# Patient Record
Sex: Male | Born: 1945 | Race: White | Hispanic: No | Smoking: Never smoker
Health system: Southern US, Community
[De-identification: ages and names within clinical notes are randomized; demographics above are authoritative.]

## PROBLEM LIST (undated history)

## (undated) DIAGNOSIS — E785 Hyperlipidemia, unspecified: Secondary | ICD-10-CM

## (undated) DIAGNOSIS — J302 Other seasonal allergic rhinitis: Secondary | ICD-10-CM

## (undated) DIAGNOSIS — F419 Anxiety disorder, unspecified: Secondary | ICD-10-CM

## (undated) DIAGNOSIS — K635 Polyp of colon: Secondary | ICD-10-CM

## (undated) DIAGNOSIS — E119 Type 2 diabetes mellitus without complications: Secondary | ICD-10-CM

## (undated) DIAGNOSIS — E559 Vitamin D deficiency, unspecified: Secondary | ICD-10-CM

## (undated) DIAGNOSIS — T7840XA Allergy, unspecified, initial encounter: Secondary | ICD-10-CM

## (undated) HISTORY — DX: Anxiety disorder, unspecified: F41.9

## (undated) HISTORY — PX: COLONOSCOPY: SHX174

## (undated) HISTORY — PX: VASECTOMY: SHX75

## (undated) HISTORY — DX: Polyp of colon: K63.5

## (undated) HISTORY — DX: Type 2 diabetes mellitus without complications: E11.9

## (undated) HISTORY — PX: POLYPECTOMY: SHX149

## (undated) HISTORY — DX: Other seasonal allergic rhinitis: J30.2

## (undated) HISTORY — PX: HERNIA REPAIR: SHX51

## (undated) HISTORY — DX: Allergy, unspecified, initial encounter: T78.40XA

## (undated) HISTORY — DX: Vitamin D deficiency, unspecified: E55.9

## (undated) HISTORY — DX: Hyperlipidemia, unspecified: E78.5

---

## 2001-10-03 HISTORY — PX: CERVICAL SPINE SURGERY: SHX589

## 2004-10-03 HISTORY — PX: ROTATOR CUFF REPAIR: SHX139

## 2011-10-04 HISTORY — PX: PARS PLANA VITRECTOMY W/ REPAIR OF MACULAR HOLE: SHX2170

## 2012-10-03 HISTORY — PX: CATARACT EXTRACTION: SUR2

## 2014-01-20 DIAGNOSIS — H903 Sensorineural hearing loss, bilateral: Secondary | ICD-10-CM | POA: Diagnosis not present

## 2014-04-15 DIAGNOSIS — D239 Other benign neoplasm of skin, unspecified: Secondary | ICD-10-CM | POA: Diagnosis not present

## 2014-04-15 DIAGNOSIS — L819 Disorder of pigmentation, unspecified: Secondary | ICD-10-CM | POA: Diagnosis not present

## 2014-04-15 DIAGNOSIS — L821 Other seborrheic keratosis: Secondary | ICD-10-CM | POA: Diagnosis not present

## 2014-04-15 DIAGNOSIS — D485 Neoplasm of uncertain behavior of skin: Secondary | ICD-10-CM | POA: Diagnosis not present

## 2014-04-15 DIAGNOSIS — D235 Other benign neoplasm of skin of trunk: Secondary | ICD-10-CM | POA: Diagnosis not present

## 2014-06-19 DIAGNOSIS — Z136 Encounter for screening for cardiovascular disorders: Secondary | ICD-10-CM | POA: Diagnosis not present

## 2014-06-19 DIAGNOSIS — F411 Generalized anxiety disorder: Secondary | ICD-10-CM | POA: Diagnosis not present

## 2014-06-19 DIAGNOSIS — F329 Major depressive disorder, single episode, unspecified: Secondary | ICD-10-CM | POA: Diagnosis not present

## 2014-06-19 DIAGNOSIS — Z1211 Encounter for screening for malignant neoplasm of colon: Secondary | ICD-10-CM | POA: Diagnosis not present

## 2014-06-19 DIAGNOSIS — E559 Vitamin D deficiency, unspecified: Secondary | ICD-10-CM | POA: Diagnosis not present

## 2014-06-19 DIAGNOSIS — F3289 Other specified depressive episodes: Secondary | ICD-10-CM | POA: Diagnosis not present

## 2014-06-19 DIAGNOSIS — R7309 Other abnormal glucose: Secondary | ICD-10-CM | POA: Diagnosis not present

## 2014-06-19 DIAGNOSIS — E785 Hyperlipidemia, unspecified: Secondary | ICD-10-CM | POA: Diagnosis not present

## 2014-06-19 DIAGNOSIS — N138 Other obstructive and reflux uropathy: Secondary | ICD-10-CM | POA: Diagnosis not present

## 2014-06-19 DIAGNOSIS — Z125 Encounter for screening for malignant neoplasm of prostate: Secondary | ICD-10-CM | POA: Diagnosis not present

## 2014-06-19 DIAGNOSIS — I10 Essential (primary) hypertension: Secondary | ICD-10-CM | POA: Diagnosis not present

## 2014-06-19 DIAGNOSIS — J3089 Other allergic rhinitis: Secondary | ICD-10-CM | POA: Diagnosis not present

## 2014-06-19 DIAGNOSIS — N401 Enlarged prostate with lower urinary tract symptoms: Secondary | ICD-10-CM | POA: Diagnosis not present

## 2014-06-19 DIAGNOSIS — Z23 Encounter for immunization: Secondary | ICD-10-CM | POA: Diagnosis not present

## 2014-06-23 DIAGNOSIS — Z1211 Encounter for screening for malignant neoplasm of colon: Secondary | ICD-10-CM | POA: Diagnosis not present

## 2014-07-02 DIAGNOSIS — Z23 Encounter for immunization: Secondary | ICD-10-CM | POA: Diagnosis not present

## 2014-07-16 DIAGNOSIS — R9431 Abnormal electrocardiogram [ECG] [EKG]: Secondary | ICD-10-CM | POA: Diagnosis not present

## 2014-07-16 DIAGNOSIS — R0602 Shortness of breath: Secondary | ICD-10-CM | POA: Diagnosis not present

## 2014-07-16 DIAGNOSIS — I491 Atrial premature depolarization: Secondary | ICD-10-CM | POA: Diagnosis not present

## 2014-07-16 DIAGNOSIS — I493 Ventricular premature depolarization: Secondary | ICD-10-CM | POA: Diagnosis not present

## 2014-07-16 DIAGNOSIS — E785 Hyperlipidemia, unspecified: Secondary | ICD-10-CM | POA: Diagnosis not present

## 2014-07-16 DIAGNOSIS — R5383 Other fatigue: Secondary | ICD-10-CM | POA: Diagnosis not present

## 2014-08-05 DIAGNOSIS — D123 Benign neoplasm of transverse colon: Secondary | ICD-10-CM | POA: Diagnosis not present

## 2014-08-05 DIAGNOSIS — K635 Polyp of colon: Secondary | ICD-10-CM | POA: Diagnosis not present

## 2014-08-05 DIAGNOSIS — K5730 Diverticulosis of large intestine without perforation or abscess without bleeding: Secondary | ICD-10-CM | POA: Diagnosis not present

## 2014-08-05 DIAGNOSIS — Z5181 Encounter for therapeutic drug level monitoring: Secondary | ICD-10-CM | POA: Diagnosis not present

## 2014-08-05 DIAGNOSIS — D122 Benign neoplasm of ascending colon: Secondary | ICD-10-CM | POA: Diagnosis not present

## 2014-08-05 DIAGNOSIS — Z1211 Encounter for screening for malignant neoplasm of colon: Secondary | ICD-10-CM | POA: Diagnosis not present

## 2014-12-15 DIAGNOSIS — E559 Vitamin D deficiency, unspecified: Secondary | ICD-10-CM | POA: Diagnosis not present

## 2014-12-15 DIAGNOSIS — E782 Mixed hyperlipidemia: Secondary | ICD-10-CM | POA: Diagnosis not present

## 2014-12-15 DIAGNOSIS — R7309 Other abnormal glucose: Secondary | ICD-10-CM | POA: Diagnosis not present

## 2014-12-15 DIAGNOSIS — Z125 Encounter for screening for malignant neoplasm of prostate: Secondary | ICD-10-CM | POA: Diagnosis not present

## 2014-12-19 DIAGNOSIS — F329 Major depressive disorder, single episode, unspecified: Secondary | ICD-10-CM | POA: Diagnosis not present

## 2014-12-19 DIAGNOSIS — E1165 Type 2 diabetes mellitus with hyperglycemia: Secondary | ICD-10-CM | POA: Diagnosis not present

## 2014-12-19 DIAGNOSIS — J302 Other seasonal allergic rhinitis: Secondary | ICD-10-CM | POA: Diagnosis not present

## 2014-12-19 DIAGNOSIS — I10 Essential (primary) hypertension: Secondary | ICD-10-CM | POA: Diagnosis not present

## 2014-12-19 DIAGNOSIS — E559 Vitamin D deficiency, unspecified: Secondary | ICD-10-CM | POA: Diagnosis not present

## 2014-12-19 DIAGNOSIS — N401 Enlarged prostate with lower urinary tract symptoms: Secondary | ICD-10-CM | POA: Diagnosis not present

## 2014-12-19 DIAGNOSIS — E782 Mixed hyperlipidemia: Secondary | ICD-10-CM | POA: Diagnosis not present

## 2015-07-06 ENCOUNTER — Ambulatory Visit: Payer: Self-pay | Admitting: Adult Health

## 2015-08-10 ENCOUNTER — Ambulatory Visit: Payer: Self-pay | Admitting: Adult Health

## 2015-08-10 DIAGNOSIS — H26492 Other secondary cataract, left eye: Secondary | ICD-10-CM | POA: Diagnosis not present

## 2015-08-10 DIAGNOSIS — H35342 Macular cyst, hole, or pseudohole, left eye: Secondary | ICD-10-CM | POA: Diagnosis not present

## 2015-08-10 DIAGNOSIS — H26491 Other secondary cataract, right eye: Secondary | ICD-10-CM | POA: Diagnosis not present

## 2015-08-10 DIAGNOSIS — Z961 Presence of intraocular lens: Secondary | ICD-10-CM | POA: Diagnosis not present

## 2015-08-10 DIAGNOSIS — H02839 Dermatochalasis of unspecified eye, unspecified eyelid: Secondary | ICD-10-CM | POA: Diagnosis not present

## 2015-09-08 ENCOUNTER — Ambulatory Visit (INDEPENDENT_AMBULATORY_CARE_PROVIDER_SITE_OTHER): Payer: Medicare Other | Admitting: Family Medicine

## 2015-09-08 ENCOUNTER — Telehealth: Payer: Self-pay | Admitting: Adult Health

## 2015-09-08 ENCOUNTER — Encounter: Payer: Self-pay | Admitting: Family Medicine

## 2015-09-08 VITALS — BP 131/74 | HR 75 | Temp 98.1°F | Resp 16 | Ht 68.0 in | Wt 191.0 lb

## 2015-09-08 DIAGNOSIS — F329 Major depressive disorder, single episode, unspecified: Secondary | ICD-10-CM

## 2015-09-08 DIAGNOSIS — F32A Depression, unspecified: Secondary | ICD-10-CM

## 2015-09-08 MED ORDER — SERTRALINE HCL 100 MG PO TABS: 150.0000 mg | ORAL_TABLET | Freq: Every day | ORAL | Status: DC

## 2015-09-08 NOTE — Telephone Encounter (Signed)
Mrs. Reitmeier called saying the pt has been out of Zoloft for days and he really needs a refill. I informed her Tommi Rumps wouldn't prescribe it until he's seen. She took him to an Urgent Care today and was told they wouldn't give him a Rx. I suggested she call [facility] off of [address] to see if they can help. She wanted me to send you a message just in case.  Pt's ph# [phone] Thank you.

## 2015-09-08 NOTE — Progress Notes (Signed)
   Subjective:    Patient ID: Jeffrey Spears, male    DOB: Jul 19, 1946, 69 y.o.   MRN: [id number]  HPI This is a pleasant 69 yo male who presents today for medication refill. He is accompanied by his wife. He has an appointment to establish care at [facility] for next week. He has been out of his sertraline for about a week and is having some increased irritation. He was previously on 100 mg and has been for a long time. He feels like it doesn't work as well as it used to and would like to go to try 150 mg.   Past Medical History  Diagnosis Date  . Hyperlipidemia    Past Surgical History  Procedure Laterality Date  . Hernia repair    . Vasectomy    . Spine surgery     Family History  Problem Relation Age of Onset  . Emphysema Mother   . Heart disease Father   . Stroke Brother   . Alzheimer's disease Brother    Social History  Substance Use Topics  . Smoking status: Never Smoker   . Smokeless tobacco: None  . Alcohol Use: 0.0 oz/week    0 Standard drinks or equivalent per week    Review of Systems No chest pain or SOB, no edema.     Objective:   Physical Exam Physical Exam  Constitutional: Oriented to person, place, and time. He appears well-developed and well-nourished.  HENT:  Head: Normocephalic and atraumatic.  Eyes: Conjunctivae are normal.  Neck: Normal range of motion. Neck supple.  Cardiovascular: Normal rate, regular rhythm and normal heart sounds.   Pulmonary/Chest: Effort normal and breath sounds normal.  Musculoskeletal: Normal range of motion.  Neurological: Alert and oriented to person, place, and time.  Skin: Skin is warm and dry.  Psychiatric: Normal mood and affect. Behavior is normal. Judgment and thought content normal.  Vitals reviewed. BP 131/74 mmHg  Pulse 75  Temp(Src) 98.1 F (36.7 C)  Resp 16  Ht 5\' 8"  (1.727 m)  Wt 191 lb (86.637 kg)  BMI 29.05 kg/m2 Depression screen [facility] 2/9 09/08/2015  Decreased Interest 0  Down, Depressed,  Hopeless 0  PHQ - 2 Score 0      Assessment & Plan:  1. Depression - will fill his sertraline for 30 days, no refills to get him to his appointment to establish care.  - sertraline (ZOLOFT) 100 MG tablet; Take 1.5 tablets (150 mg total) by mouth daily.  Dispense: 45 tablet; Refill: 0  Clarene Reamer, FNP-BC  [facility]  09/08/2015 2:01 PM

## 2015-09-08 NOTE — Telephone Encounter (Signed)
He needs to see me before I will send him in anything

## 2015-09-16 ENCOUNTER — Ambulatory Visit (INDEPENDENT_AMBULATORY_CARE_PROVIDER_SITE_OTHER): Payer: Medicare Other | Admitting: Adult Health

## 2015-09-16 ENCOUNTER — Encounter: Payer: Self-pay | Admitting: Adult Health

## 2015-09-16 VITALS — BP 110/70 | Temp 98.5°F | Ht 68.0 in | Wt 195.4 lb

## 2015-09-16 DIAGNOSIS — F411 Generalized anxiety disorder: Secondary | ICD-10-CM | POA: Diagnosis not present

## 2015-09-16 DIAGNOSIS — E119 Type 2 diabetes mellitus without complications: Secondary | ICD-10-CM | POA: Diagnosis not present

## 2015-09-16 DIAGNOSIS — Z23 Encounter for immunization: Secondary | ICD-10-CM

## 2015-09-16 DIAGNOSIS — Z7689 Persons encountering health services in other specified circumstances: Secondary | ICD-10-CM

## 2015-09-16 DIAGNOSIS — Z7189 Other specified counseling: Secondary | ICD-10-CM

## 2015-09-16 MED ORDER — SERTRALINE HCL 100 MG PO TABS: 150.0000 mg | ORAL_TABLET | Freq: Every day | ORAL | Status: DC

## 2015-09-16 MED ORDER — GLUCOSE BLOOD VI STRP: 1.0000 | ORAL_STRIP | Freq: Every day | Status: DC

## 2015-09-16 NOTE — Progress Notes (Signed)
Pre visit review using our clinic review tool, if applicable. No additional management support is needed unless otherwise documented below in the visit note. 

## 2015-09-16 NOTE — Patient Instructions (Signed)
It was great meeting you today!  Follow up with me in March for your next physical. If you need anything in the meantime,then please let me know.   Continue to monitor your blood sugars.

## 2015-09-16 NOTE — Progress Notes (Signed)
HPI:  Jeffrey Spears is here to establish care. He is a pleasant caucasian male who  has a past medical history of Hyperlipidemia; Seasonal allergies; High cholesterol; and Colon polyp. He has recently moved from [place] to [place] so that he and his wife can be closer to family.    Last PCP and physical: March 2016 with Primary Care  Immunizations:UTD Diet: Eats healthy  Exercise: He does exercise. Walks and belongs to the [facility] Colonoscopy: Every three years due to polyps  Has the following chronic problems that require follow up and concerns today:  Anxiety  - Takes Zoloft and feels like 150 mg has been the best dose for his anxiety.   Diabetes  - He was diagnosed later in life by his old PCP. His records indicate that the most recent A1c is 7.0. He does not monitor his glucose often and does not take any medications.    ROS negative for unless reported above: fevers, chills,feeling poorly, unintentional weight loss, hearing or vision loss, chest pain, palpitations, leg claudication, struggling to breath,Not feeling congested in the chest, no orthopenia, no cough,no wheezing, normal appetite, no soft tissue swelling, no hemoptysis, melena, hematochezia, hematuria, falls, loc, si, or thoughts of self harm.    Past Medical History  Diagnosis Date  . Hyperlipidemia     Past Surgical History  Procedure Laterality Date  . Hernia repair    . Vasectomy    . Spine surgery      Family History  Problem Relation Age of Onset  . Emphysema Mother   . Heart disease Father   . Stroke Brother   . Alzheimer's disease Brother     Social History   Social History  . Marital Status: Married    Spouse Name: N/A  . Number of Children: N/A  . Years of Education: N/A   Social History Main Topics  . Smoking status: Never Smoker   . Smokeless tobacco: None  . Alcohol Use: 0.0 oz/week    0 Standard drinks or equivalent per week  . Drug Use: No  . Sexual Activity: Yes    Other Topics Concern  . None   Social History Narrative     Current outpatient prescriptions:  .  ACCU-CHEK SOFTCLIX LANCETS lancets, by Other route. Check once daily., Disp: , Rfl:  .  ergocalciferol (VITAMIN D2) 50000 UNITS capsule, Take 50,000 Units by mouth once a week., Disp: , Rfl:  .  glucose blood (ACCU-CHEK AVIVA PLUS) test strip, 1 each by Other route daily. Use as instructed, Disp: , Rfl:  .  sertraline (ZOLOFT) 100 MG tablet, Take 1.5 tablets (150 mg total) by mouth daily., Disp: 45 tablet, Rfl: 0 .  tamsulosin (FLOMAX) 0.4 MG CAPS capsule, Take 0.4 mg by mouth 2 (two) times daily., Disp: , Rfl:  .  atorvastatin (LIPITOR) 40 MG tablet, Take 40 mg by mouth daily., Disp: , Rfl:  .  fluticasone (FLONASE) 50 MCG/ACT nasal spray, Place into both nostrils daily., Disp: , Rfl:   EXAM:  Filed Vitals:   09/16/15 1355  Temp: 98.5 F (36.9 C)    Body mass index is 29.72 kg/(m^2).  GENERAL: vitals reviewed and listed above, alert, oriented, appears well hydrated and in no acute distress.  HEENT: atraumatic, conjunttiva clear, no obvious abnormalities on inspection of external nose and ears  NECK: Neck is soft and supple without masses, no adenopathy or thyromegaly, trachea midline, no JVD. Normal range of motion.   LUNGS: clear to  auscultation bilaterally, no wheezes, rales or rhonchi, good air movement  CV: Regular rate and rhythm, normal S1/S2, no audible murmurs, gallops, or rubs. No carotid bruit and no peripheral edema.   MS: moves all extremities without noticeable abnormality. No edema noted  Abd: soft/nontender/nondistended/normal bowel sounds   Skin: warm and dry, no rash   Extremities: No clubbing, cyanosis, or edema. Capillary refill is WNL. Pulses intact bilaterally in upper and lower extremities.   Neuro: CN II-XII intact, sensation and reflexes normal throughout, 5/5 muscle strength in bilateral upper and lower extremities. Normal finger to nose. Normal  rapid alternating movements. Normal romberg. No pronator drift.   PSYCH: pleasant and cooperative, no obvious depression or anxiety  ASSESSMENT AND PLAN:  1. Encounter to establish care - Follow up in March for CPE - Follow up sooner if needed - Work on diet and exercise 2. Generalized anxiety disorder - sertraline (ZOLOFT) 100 MG tablet; Take 1.5 tablets (150 mg total) by mouth daily.  Dispense: 145 tablet; Refill: 3  3. Controlled type 2 diabetes mellitus without complication, without long-term current use of insulin (HCC) - ACCU-CHEK SOFTCLIX LANCETS lancets; by Other route. Check once daily. - glucose blood (ACCU-CHEK AVIVA PLUS) test strip; 1 each by Other route daily. Use as instructed  Dispense: 100 each; Refill: 3 - Advised to keep closer control of his blood sugars.    Discussed the following assessment and plan:  -We reviewed the PMH, PSH, FH, SH, Meds and Allergies. -We provided refills for any medications we will prescribe as needed. -We addressed current concerns per orders and patient instructions. -We have asked for records for pertinent exams, studies, vaccines and notes from previous providers. -We have advised patient to follow up per instructions below.   -Patient advised to return or notify a provider immediately if symptoms worsen or persist or new concerns arise.    Dorothyann Peng, AGNP

## 2015-10-22 DIAGNOSIS — S39012A Strain of muscle, fascia and tendon of lower back, initial encounter: Secondary | ICD-10-CM | POA: Diagnosis not present

## 2015-10-22 DIAGNOSIS — M62830 Muscle spasm of back: Secondary | ICD-10-CM | POA: Diagnosis not present

## 2015-10-22 DIAGNOSIS — M5136 Other intervertebral disc degeneration, lumbar region: Secondary | ICD-10-CM | POA: Diagnosis not present

## 2015-10-22 DIAGNOSIS — M545 Low back pain: Secondary | ICD-10-CM | POA: Diagnosis not present

## 2015-12-15 ENCOUNTER — Telehealth: Payer: Self-pay | Admitting: Adult Health

## 2015-12-15 ENCOUNTER — Ambulatory Visit (INDEPENDENT_AMBULATORY_CARE_PROVIDER_SITE_OTHER): Payer: Medicare Other | Admitting: Adult Health

## 2015-12-15 ENCOUNTER — Other Ambulatory Visit: Payer: Self-pay | Admitting: Adult Health

## 2015-12-15 ENCOUNTER — Encounter: Payer: Self-pay | Admitting: Adult Health

## 2015-12-15 VITALS — BP 120/80 | Temp 97.9°F | Ht 68.0 in | Wt 195.6 lb

## 2015-12-15 DIAGNOSIS — E119 Type 2 diabetes mellitus without complications: Secondary | ICD-10-CM | POA: Diagnosis not present

## 2015-12-15 DIAGNOSIS — N401 Enlarged prostate with lower urinary tract symptoms: Secondary | ICD-10-CM

## 2015-12-15 DIAGNOSIS — E559 Vitamin D deficiency, unspecified: Secondary | ICD-10-CM | POA: Insufficient documentation

## 2015-12-15 DIAGNOSIS — Z0000 Encounter for general adult medical examination without abnormal findings: Secondary | ICD-10-CM

## 2015-12-15 DIAGNOSIS — N138 Other obstructive and reflux uropathy: Secondary | ICD-10-CM

## 2015-12-15 DIAGNOSIS — E785 Hyperlipidemia, unspecified: Secondary | ICD-10-CM | POA: Diagnosis not present

## 2015-12-15 LAB — POC URINALSYSI DIPSTICK (AUTOMATED)
BILIRUBIN UA: NEGATIVE
Glucose, UA: NEGATIVE
KETONES UA: NEGATIVE
Leukocytes, UA: NEGATIVE
Nitrite, UA: NEGATIVE
PH UA: 7
Protein, UA: NEGATIVE
RBC UA: NEGATIVE
SPEC GRAV UA: 1.02
Urobilinogen, UA: 0.2

## 2015-12-15 LAB — CBC WITH DIFFERENTIAL/PLATELET
BASOS PCT: 0.4 % (ref 0.0–3.0)
Basophils Absolute: 0 10*3/uL (ref 0.0–0.1)
EOS ABS: 0.3 10*3/uL (ref 0.0–0.7)
Eosinophils Relative: 6 % — ABNORMAL HIGH (ref 0.0–5.0)
HCT: 41.7 % (ref 39.0–52.0)
HEMOGLOBIN: 14 g/dL (ref 13.0–17.0)
Lymphocytes Relative: 27.9 % (ref 12.0–46.0)
Lymphs Abs: 1.2 10*3/uL (ref 0.7–4.0)
MCHC: 33.6 g/dL (ref 30.0–36.0)
MCV: 90.3 fl (ref 78.0–100.0)
MONO ABS: 0.4 10*3/uL (ref 0.1–1.0)
Monocytes Relative: 8.3 % (ref 3.0–12.0)
Neutro Abs: 2.5 10*3/uL (ref 1.4–7.7)
Neutrophils Relative %: 57.4 % (ref 43.0–77.0)
Platelets: 247 10*3/uL (ref 150.0–400.0)
RBC: 4.62 Mil/uL (ref 4.22–5.81)
RDW: 14 % (ref 11.5–15.5)
WBC: 4.4 10*3/uL (ref 4.0–10.5)

## 2015-12-15 LAB — LIPID PANEL
CHOLESTEROL: 272 mg/dL — AB (ref 0–200)
HDL: 47.8 mg/dL (ref >39.00)
LDL CALC: 189 mg/dL — AB (ref 0–99)
NonHDL: 224.42
Total CHOL/HDL Ratio: 6
Triglycerides: 176 mg/dL — ABNORMAL HIGH (ref 0.0–149.0)
VLDL: 35.2 mg/dL (ref 0.0–40.0)

## 2015-12-15 LAB — HEPATIC FUNCTION PANEL
ALT: 13 U/L (ref 0–53)
AST: 16 U/L (ref 0–37)
Albumin: 4 g/dL (ref 3.5–5.2)
Alkaline Phosphatase: 54 U/L (ref 39–117)
BILIRUBIN TOTAL: 0.5 mg/dL (ref 0.2–1.2)
Bilirubin, Direct: 0.1 mg/dL (ref 0.0–0.3)
Total Protein: 7.1 g/dL (ref 6.0–8.3)

## 2015-12-15 LAB — BASIC METABOLIC PANEL
BUN: 20 mg/dL (ref 6–23)
CALCIUM: 9.1 mg/dL (ref 8.4–10.5)
CO2: 30 mEq/L (ref 19–32)
CREATININE: 1.1 mg/dL (ref 0.40–1.50)
Chloride: 102 mEq/L (ref 96–112)
GFR: 70.34 mL/min (ref >60.00)
Glucose, Bld: 123 mg/dL — ABNORMAL HIGH (ref 70–99)
Potassium: 4.6 mEq/L (ref 3.5–5.1)
Sodium: 139 mEq/L (ref 135–145)

## 2015-12-15 LAB — VITAMIN D 25 HYDROXY (VIT D DEFICIENCY, FRACTURES): VITD: 12.24 ng/mL — ABNORMAL LOW (ref 30.00–100.00)

## 2015-12-15 LAB — TSH: TSH: 1.34 u[IU]/mL (ref 0.35–4.50)

## 2015-12-15 LAB — HEMOGLOBIN A1C: HEMOGLOBIN A1C: 6.5 % (ref 4.6–6.5)

## 2015-12-15 LAB — PSA: PSA: 8.44 ng/mL — ABNORMAL HIGH (ref 0.10–4.00)

## 2015-12-15 MED ORDER — TAMSULOSIN HCL 0.4 MG PO CAPS: 0.4000 mg | ORAL_CAPSULE | Freq: Two times a day (BID) | ORAL | Status: DC

## 2015-12-15 MED ORDER — ATORVASTATIN CALCIUM 40 MG PO TABS: 40.0000 mg | ORAL_TABLET | Freq: Every day | ORAL | Status: DC

## 2015-12-15 MED ORDER — FLUTICASONE PROPIONATE 50 MCG/ACT NA SUSP: 2.0000 | Freq: Every day | NASAL | Status: DC

## 2015-12-15 NOTE — Progress Notes (Signed)
Pre visit review using our clinic review tool, if applicable. No additional management support is needed unless otherwise documented below in the visit note. 

## 2015-12-15 NOTE — Telephone Encounter (Signed)
Spoke to [place] and informed him of his labs. He is going to restart Lipitor 40mg  and 800 units of Vitamin D

## 2015-12-15 NOTE — Progress Notes (Signed)
Subjective:  Patient presents today for their annual wellness visit. He is a pleasant Caucasian male who  has a past medical history of Hyperlipidemia; Seasonal allergies; Colon polyp; Anxiety; Vitamin D deficiency; and Diabetes mellitus ([place]).    Preventive Screening-Counseling & Management  Smoking Status: Never Smoker Second Hand Smoking status: No smokers in home  Risk Factors Regular exercise: Walks and belongs to [facility] Diet: Eats healthy Fall Risk: None  Cardiac risk factors:  advanced age (older than 23 for men, 61 for women)  Hyperlipidemia Diabetes.  Family History: Heart Disease and Stroke  Depression Screen None. PHQ2 0   Activities of Daily Living Independent ADLs and IADLs  Hearing Difficulties:   Cognitive Testing No reported trouble.   Normal 3 word recall  List the Names of Other Physician/Practitioners you currently use: 1.None  Immunization History  Administered Date(s) Administered  . Influenza, High Dose Seasonal PF 09/16/2015  . Influenza-Unspecified 07/27/2010, 10/11/2011, 07/19/2013, 06/19/2014  . Pneumococcal Conjugate-13 07/02/2014  . Pneumococcal Polysaccharide-23 10/11/2011  . Tdap 08/12/2013   Required Immunizations needed today: None, UTD  Screening tests- up to date Health Maintenance Due  Topic Date Due  . Hepatitis C Screening  [birth year]  . FOOT EXAM  12/10/1955  . OPHTHALMOLOGY EXAM  12/10/1955  . HEMOGLOBIN A1C  06/17/2015  . COLONOSCOPY  10/04/2015  . URINE MICROALBUMIN  12/15/2015    ROS- No pertinent positives discovered in course of AWV  The following were reviewed and entered/updated in epic: Past Medical History  Diagnosis Date  . Hyperlipidemia   . Seasonal allergies   . Colon polyp   . Anxiety   . Vitamin D deficiency   . Diabetes mellitus [facility])    Patient Active Problem List   Diagnosis Date Noted  . Diabetes type 2, controlled ([place]) 09/16/2015   Past Surgical History  Procedure  Laterality Date  . Hernia repair    . Vasectomy      1982  . Cervical spine surgery  2003    herniated disk   . Rotator cuff repair  2006    left shoulder  . Cataract extraction  2014    bilateral   . Pars plana vitrectomy w/ repair of macular hole  2013    Family History  Problem Relation Age of Onset  . Emphysema Mother   . Heart disease Father   . Stroke Brother   . Alzheimer's disease Brother   . Alzheimer's disease Maternal Grandmother     Medications- reviewed and updated Current Outpatient Prescriptions  Medication Sig Dispense Refill  . ACCU-CHEK SOFTCLIX LANCETS lancets by Other route. Check once daily.    Marland Kitchen atorvastatin (LIPITOR) 40 MG tablet Take 40 mg by mouth daily.    . ergocalciferol (VITAMIN D2) 50000 UNITS capsule Take 50,000 Units by mouth once a week.    . fluticasone (FLONASE) 50 MCG/ACT nasal spray Place 2 sprays into both nostrils daily. 48 g 3  . glucose blood (ACCU-CHEK AVIVA PLUS) test strip 1 each by Other route daily. Use as instructed 100 each 3  . sertraline (ZOLOFT) 100 MG tablet Take 1.5 tablets (150 mg total) by mouth daily. 145 tablet 3  . tamsulosin (FLOMAX) 0.4 MG CAPS capsule Take 1 capsule (0.4 mg total) by mouth 2 (two) times daily. 90 capsule 3   No current facility-administered medications for this visit.    Allergies-reviewed and updated No Known Allergies  Social History   Social History  . Marital Status: Married  Spouse Name: N/A  . Number of Children: N/A  . Years of Education: N/A   Social History Main Topics  . Smoking status: Never Smoker   . Smokeless tobacco: None  . Alcohol Use: 0.0 oz/week    0 Standard drinks or equivalent per week  . Drug Use: No  . Sexual Activity: Yes   Other Topics Concern  . 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        Objective: BP 120/80 mmHg  Temp(Src) 97.9 F (36.6  C) (Oral)  Ht 5\' 8"  (1.727 m)  Wt 195 lb 9.6 oz (88.724 kg)  BMI 29.75 kg/m2 GENERAL: vitals reviewed and listed above, alert, oriented, appears well hydrated and in no acute distress.  HEENT: atraumatic, conjunttiva clear, no obvious abnormalities on inspection of external nose and ears  NECK: Neck is soft and supple without masses, no adenopathy or thyromegaly, trachea midline, no JVD. Normal range of motion.   LUNGS: clear to auscultation bilaterally, no wheezes, rales or rhonchi, good air movement  CV: Regular rate and rhythm, normal S1/S2, no audible murmurs, gallops, or rubs. No carotid bruit and no peripheral edema.   MS: moves all extremities without noticeable abnormality. No edema noted  Abd: soft/nontender/nondistended/normal bowel sounds .Slightly obese  Prostate: No masses or lumps. Slightly enlarged. Guaiac Negative  Skin: warm and dry, no rash   Extremities: No clubbing, cyanosis, or edema. Capillary refill is WNL. Pulses intact bilaterally in upper and lower extremities.   Neuro: CN II-XII intact, sensation and reflexes normal throughout, 5/5 muscle strength in bilateral upper and lower extremities. Normal finger to nose. Normal rapid alternating movements. Normal romberg. No pronator drift.   PSYCH: pleasant and cooperative, no obvious depression or anxiety  Assessment/Plan: 1. Hyperlipidemia  - EKG 12-Lead- NSR,Rate 62 - POCT Urinalysis Dipstick (Automated) - Basic metabolic panel - CBC with Differential/Platelet - Hemoglobin A1c - Hepatic function panel - Lipid panel - TSH - PSA  2. Medicare annual wellness visit, subsequent - Follow up in one year - Continue to exercise and eat healthy - Hep C Antibody - Reviewed medicare wellness form.   3. Controlled type 2 diabetes mellitus without complication, without long-term current use of insulin ([place]) - Diet controlled currently.  - Basic metabolic panel - CBC with Differential/Platelet - Hemoglobin  A1c - Hepatic function panel - Lipid panel - TSH  4. BPH with urinary obstruction - tamsulosin (FLOMAX) 0.4 MG CAPS capsule; Take 1 capsule (0.4 mg total) by mouth 2 (two) times daily.  Dispense: 90 capsule; Refill: 3 - PSA  5. Vitamin D deficiency - Vitamin D, 25-hydroxy - Consider Vitamin D supplement.    Orders Placed This Encounter  Procedures  . EKG 12-Lead    Meds ordered this encounter  Medications  . fluticasone (FLONASE) 50 MCG/ACT nasal spray    Sig: Place 2 sprays into both nostrils daily.    Dispense:  48 g    Refill:  3  . tamsulosin (FLOMAX) 0.4 MG CAPS capsule    Sig: Take 1 capsule (0.4 mg total) by mouth 2 (two) times daily.    Dispense:  90 capsule    Refill:  [place]

## 2015-12-15 NOTE — Patient Instructions (Addendum)
It was great seeing you again! I am glad that you are adjusting well to [place].   Your exam looks great! I will follow up with you regarding your blood work.   Follow up with me in one year for your next exam or sooner if needed  Health Maintenance, Male A healthy lifestyle and preventative care can promote health and wellness.  Maintain regular health, dental, and eye exams.  Eat a healthy diet. Foods like vegetables, fruits, whole grains, low-fat dairy products, and lean protein foods contain the nutrients you need and are low in calories. Decrease your intake of foods high in solid fats, added sugars, and salt. Get information about a proper diet from your health care provider, if necessary.  Regular physical exercise is one of the most important things you can do for your health. Most adults should get at least 150 minutes of moderate-intensity exercise (any activity that increases your heart rate and causes you to sweat) each week. In addition, most adults need muscle-strengthening exercises on 2 or more days a week.   Maintain a healthy weight. The body mass index (BMI) is a screening tool to identify possible weight problems. It provides an estimate of body fat based on height and weight. Your health care provider can find your BMI and can help you achieve or maintain a healthy weight. For males 20 years and older:  A BMI below 18.5 is considered underweight.  A BMI of 18.5 to 24.9 is normal.  A BMI of 25 to 29.9 is considered overweight.  A BMI of 30 and above is considered obese.  Maintain normal blood lipids and cholesterol by exercising and minimizing your intake of saturated fat. Eat a balanced diet with plenty of fruits and vegetables. Blood tests for lipids and cholesterol should begin at age 60 and be repeated every 5 years. If your lipid or cholesterol levels are high, you are over age 8, or you are at high risk for heart disease, you may need your cholesterol levels  checked more frequently.Ongoing high lipid and cholesterol levels should be treated with medicines if diet and exercise are not working.  If you smoke, find out from your health care provider how to quit. If you do not use tobacco, do not start.  Lung cancer screening is recommended for adults aged 20-80 years who are at high risk for developing lung cancer because of a history of smoking. A yearly low-dose CT scan of the lungs is recommended for people who have at least a 30-pack-year history of smoking and are current smokers or have quit within the past 15 years. A pack year of smoking is smoking an average of 1 pack of cigarettes a day for 1 year (for example, a 30-pack-year history of smoking could mean smoking 1 pack a day for 30 years or 2 packs a day for 15 years). Yearly screening should continue until the smoker has stopped smoking for at least 15 years. Yearly screening should be stopped for people who develop a health problem that would prevent them from having lung cancer treatment.  If you choose to drink alcohol, do not have more than 2 drinks per day. One drink is considered to be 12 oz (360 mL) of beer, 5 oz (150 mL) of wine, or 1.5 oz (45 mL) of liquor.  Avoid the use of street drugs. Do not share needles with anyone. Ask for help if you need support or instructions about stopping the use of drugs.  High  blood pressure causes heart disease and increases the risk of stroke. High blood pressure is more likely to develop in:  People who have blood pressure in the end of the normal range (100-139/85-89 mm Hg).  People who are overweight or obese.  People who are African American.  If you are 9-35 years of age, have your blood pressure checked every 3-5 years. If you are 46 years of age or older, have your blood pressure checked every year. You should have your blood pressure measured twice--once when you are at a hospital or clinic, and once when you are not at a hospital or clinic.  Record the average of the two measurements. To check your blood pressure when you are not at a hospital or clinic, you can use:  An automated blood pressure machine at a pharmacy.  A home blood pressure monitor.  If you are 69-10 years old, ask your health care provider if you should take aspirin to prevent heart disease.  Diabetes screening involves taking a blood sample to check your fasting blood sugar level. This should be done once every 3 years after age 56 if you are at a normal weight and without risk factors for diabetes. Testing should be considered at a younger age or be carried out more frequently if you are overweight and have at least 1 risk factor for diabetes.  Colorectal cancer can be detected and often prevented. Most routine colorectal cancer screening begins at the age of 52 and continues through age 43. However, your health care provider may recommend screening at an earlier age if you have risk factors for colon cancer. On a yearly basis, your health care provider may provide home test kits to check for hidden blood in the stool. A small camera at the end of a tube may be used to directly examine the colon (sigmoidoscopy or colonoscopy) to detect the earliest forms of colorectal cancer. Talk to your health care provider about this at age 55 when routine screening begins. A direct exam of the colon should be repeated every 5-10 years through age 35, unless early forms of precancerous polyps or small growths are found.  People who are at an increased risk for hepatitis B should be screened for this virus. You are considered at high risk for hepatitis B if:  You were born in a country where hepatitis B occurs often. Talk with your health care provider about which countries are considered high risk.  Your parents were born in a high-risk country and you have not received a shot to protect against hepatitis B (hepatitis B vaccine).  You have HIV or AIDS.  You use needles to  inject street drugs.  You live with, or have sex with, someone who has hepatitis B.  You are a man who has sex with other men (MSM).  You get hemodialysis treatment.  You take certain medicines for conditions like cancer, organ transplantation, and autoimmune conditions.  Hepatitis C blood testing is recommended for all people born from [birth year] through 1965 and any individual with known risk factors for hepatitis C.  Healthy men should no longer receive prostate-specific antigen (PSA) blood tests as part of routine cancer screening. Talk to your health care provider about prostate cancer screening.  Testicular cancer screening is not recommended for adolescents or adult males who have no symptoms. Screening includes self-exam, a health care provider exam, and other screening tests. Consult with your health care provider about any symptoms you have or any concerns  you have about testicular cancer.  Practice safe sex. Use condoms and avoid high-risk sexual practices to reduce the spread of sexually transmitted infections (STIs).  You should be screened for STIs, including gonorrhea and chlamydia if:  You are sexually active and are younger than 24 years.  You are older than 24 years, and your health care provider tells you that you are at risk for this type of infection.  Your sexual activity has changed since you were last screened, and you are at an increased risk for chlamydia or gonorrhea. Ask your health care provider if you are at risk.  If you are at risk of being infected with HIV, it is recommended that you take a prescription medicine daily to prevent HIV infection. This is called pre-exposure prophylaxis (PrEP). You are considered at risk if:  You are a man who has sex with other men (MSM).  You are a heterosexual man who is sexually active with multiple partners.  You take drugs by injection.  You are sexually active with a partner who has HIV.  Talk with your health care  provider about whether you are at high risk of being infected with HIV. If you choose to begin PrEP, you should first be tested for HIV. You should then be tested every 3 months for as long as you are taking PrEP.  Use sunscreen. Apply sunscreen liberally and repeatedly throughout the day. You should seek shade when your shadow is shorter than you. Protect yourself by wearing long sleeves, pants, a wide-brimmed hat, and sunglasses year round whenever you are outdoors.  Tell your health care provider of new moles or changes in moles, especially if there is a change in shape or color. Also, tell your health care provider if a mole is larger than the size of a pencil eraser.  A one-time screening for abdominal aortic aneurysm (AAA) and surgical repair of large AAAs by ultrasound is recommended for men aged 48-75 years who are current or former smokers.  Stay current with your vaccines (immunizations).   This information is not intended to replace advice given to you by your health care provider. Make sure you discuss any questions you have with your health care provider.   Document Released: 03/17/2008 Document Revised: 10/10/2014 Document Reviewed: 02/14/2011 Elsevier Interactive Patient Education [employer].

## 2015-12-16 LAB — HEPATITIS C ANTIBODY: HCV Ab: NEGATIVE

## 2015-12-22 ENCOUNTER — Telehealth: Payer: Self-pay | Admitting: Adult Health

## 2015-12-22 NOTE — Telephone Encounter (Addendum)
Pt has medicare and needs referral for audiiologist exam . Pt will see teryl delagrange fax [phone] phone [phone]. Pt has an appt on 12-24-15

## 2015-12-22 NOTE — Telephone Encounter (Signed)
See below

## 2015-12-23 ENCOUNTER — Other Ambulatory Visit: Payer: Self-pay | Admitting: Adult Health

## 2015-12-23 DIAGNOSIS — Z0110 Encounter for examination of ears and hearing without abnormal findings: Secondary | ICD-10-CM

## 2015-12-23 NOTE — Telephone Encounter (Signed)
Order placed

## 2015-12-23 NOTE — Telephone Encounter (Signed)
Patient notified

## 2015-12-27 DIAGNOSIS — R300 Dysuria: Secondary | ICD-10-CM | POA: Diagnosis not present

## 2016-01-06 DIAGNOSIS — H903 Sensorineural hearing loss, bilateral: Secondary | ICD-10-CM | POA: Diagnosis not present

## 2016-02-08 DIAGNOSIS — H26491 Other secondary cataract, right eye: Secondary | ICD-10-CM | POA: Diagnosis not present

## 2016-05-11 ENCOUNTER — Other Ambulatory Visit: Payer: Self-pay | Admitting: Adult Health

## 2016-05-11 DIAGNOSIS — N401 Enlarged prostate with lower urinary tract symptoms: Principal | ICD-10-CM

## 2016-05-11 DIAGNOSIS — N138 Other obstructive and reflux uropathy: Secondary | ICD-10-CM

## 2016-07-02 ENCOUNTER — Other Ambulatory Visit: Payer: Self-pay | Admitting: Adult Health

## 2016-07-02 DIAGNOSIS — N138 Other obstructive and reflux uropathy: Secondary | ICD-10-CM

## 2016-07-02 DIAGNOSIS — N401 Enlarged prostate with lower urinary tract symptoms: Principal | ICD-10-CM

## 2016-08-20 ENCOUNTER — Other Ambulatory Visit: Payer: Self-pay | Admitting: Adult Health

## 2016-08-20 DIAGNOSIS — F411 Generalized anxiety disorder: Secondary | ICD-10-CM

## 2016-09-22 ENCOUNTER — Other Ambulatory Visit: Payer: Self-pay | Admitting: Emergency Medicine

## 2016-10-05 ENCOUNTER — Other Ambulatory Visit: Payer: Self-pay

## 2016-10-05 ENCOUNTER — Telehealth: Payer: Self-pay | Admitting: Adult Health

## 2016-10-05 DIAGNOSIS — F411 Generalized anxiety disorder: Secondary | ICD-10-CM

## 2016-10-05 MED ORDER — SERTRALINE HCL 100 MG PO TABS: 150.0000 mg | ORAL_TABLET | Freq: Every day | ORAL | 1 refills | Status: DC

## 2016-10-05 NOTE — Telephone Encounter (Signed)
Rx has been sent in. 

## 2016-10-05 NOTE — Telephone Encounter (Signed)
Pt needs refill on sertraline 100 mg #90 w/refills [facility]

## 2016-10-05 NOTE — Telephone Encounter (Signed)
Ok to refill. 135 pills + 1 refill

## 2016-10-05 NOTE — Telephone Encounter (Signed)
Ok to refill 

## 2016-10-07 ENCOUNTER — Ambulatory Visit: Payer: Medicare Other | Admitting: Adult Health

## 2016-10-12 ENCOUNTER — Telehealth: Payer: Self-pay | Admitting: Adult Health

## 2016-10-12 NOTE — Telephone Encounter (Signed)
Reference # [id number]  Sertaline 100mg  [facility] need to have the supervising provider of Jeffrey Spears (full name,address,NPI) and the persons that is approving this informations (full name and title) so that medication can be sent to patient.

## 2016-10-13 NOTE — Telephone Encounter (Signed)
I called and spoke with pharmacist at [facility] and information has been given. Rx will be sent out. Thanks!

## 2016-11-07 ENCOUNTER — Other Ambulatory Visit: Payer: Self-pay | Admitting: Adult Health

## 2016-11-07 DIAGNOSIS — N138 Other obstructive and reflux uropathy: Secondary | ICD-10-CM

## 2016-11-07 DIAGNOSIS — N401 Enlarged prostate with lower urinary tract symptoms: Principal | ICD-10-CM

## 2016-12-15 ENCOUNTER — Ambulatory Visit (INDEPENDENT_AMBULATORY_CARE_PROVIDER_SITE_OTHER): Payer: Medicare Other | Admitting: Adult Health

## 2016-12-15 ENCOUNTER — Other Ambulatory Visit: Payer: Self-pay

## 2016-12-15 ENCOUNTER — Other Ambulatory Visit: Payer: Self-pay | Admitting: Adult Health

## 2016-12-15 VITALS — BP 124/82 | Temp 98.1°F | Ht 68.0 in | Wt 196.0 lb

## 2016-12-15 DIAGNOSIS — E559 Vitamin D deficiency, unspecified: Secondary | ICD-10-CM

## 2016-12-15 DIAGNOSIS — E785 Hyperlipidemia, unspecified: Secondary | ICD-10-CM | POA: Diagnosis not present

## 2016-12-15 DIAGNOSIS — E119 Type 2 diabetes mellitus without complications: Secondary | ICD-10-CM

## 2016-12-15 DIAGNOSIS — Z1211 Encounter for screening for malignant neoplasm of colon: Secondary | ICD-10-CM

## 2016-12-15 DIAGNOSIS — N138 Other obstructive and reflux uropathy: Secondary | ICD-10-CM

## 2016-12-15 DIAGNOSIS — N401 Enlarged prostate with lower urinary tract symptoms: Secondary | ICD-10-CM

## 2016-12-15 LAB — CBC WITH DIFFERENTIAL/PLATELET
BASOS PCT: 0.7 % (ref 0.0–3.0)
Basophils Absolute: 0 10*3/uL (ref 0.0–0.1)
EOS PCT: 4 % (ref 0.0–5.0)
Eosinophils Absolute: 0.2 10*3/uL (ref 0.0–0.7)
HEMATOCRIT: 42.5 % (ref 39.0–52.0)
HEMOGLOBIN: 14.1 g/dL (ref 13.0–17.0)
LYMPHS PCT: 13.7 % (ref 12.0–46.0)
Lymphs Abs: 0.8 10*3/uL (ref 0.7–4.0)
MCHC: 33.1 g/dL (ref 30.0–36.0)
MCV: 91.4 fl (ref 78.0–100.0)
Monocytes Absolute: 0.4 10*3/uL (ref 0.1–1.0)
Monocytes Relative: 6.8 % (ref 3.0–12.0)
Neutro Abs: 4.5 10*3/uL (ref 1.4–7.7)
Neutrophils Relative %: 74.8 % (ref 43.0–77.0)
Platelets: 216 10*3/uL (ref 150.0–400.0)
RBC: 4.65 Mil/uL (ref 4.22–5.81)
RDW: 13.7 % (ref 11.5–15.5)
WBC: 6.1 10*3/uL (ref 4.0–10.5)

## 2016-12-15 LAB — LIPID PANEL
CHOL/HDL RATIO: 3
Cholesterol: 181 mg/dL (ref 0–200)
HDL: 56.8 mg/dL (ref >39.00)
LDL CALC: 103 mg/dL — AB (ref 0–99)
NONHDL: 124.2
TRIGLYCERIDES: 105 mg/dL (ref 0.0–149.0)
VLDL: 21 mg/dL (ref 0.0–40.0)

## 2016-12-15 LAB — POC URINALSYSI DIPSTICK (AUTOMATED)
BILIRUBIN UA: NEGATIVE
GLUCOSE UA: NEGATIVE
KETONES UA: NEGATIVE
Leukocytes, UA: NEGATIVE
Nitrite, UA: NEGATIVE
PH UA: 6
Protein, UA: NEGATIVE
RBC UA: NEGATIVE
SPEC GRAV UA: 1.025
Urobilinogen, UA: 0.2

## 2016-12-15 LAB — MICROALBUMIN / CREATININE URINE RATIO
CREATININE, U: 170.7 mg/dL
MICROALB/CREAT RATIO: 0.4 mg/g (ref 0.0–30.0)
Microalb, Ur: 0.7 mg/dL (ref 0.0–1.9)

## 2016-12-15 LAB — BASIC METABOLIC PANEL
BUN: 18 mg/dL (ref 6–23)
CO2: 29 mEq/L (ref 19–32)
CREATININE: 1.07 mg/dL (ref 0.40–1.50)
Calcium: 9 mg/dL (ref 8.4–10.5)
Chloride: 104 mEq/L (ref 96–112)
GFR: 72.41 mL/min (ref >60.00)
Glucose, Bld: 120 mg/dL — ABNORMAL HIGH (ref 70–99)
POTASSIUM: 4.2 meq/L (ref 3.5–5.1)
SODIUM: 142 meq/L (ref 135–145)

## 2016-12-15 LAB — VITAMIN D 25 HYDROXY (VIT D DEFICIENCY, FRACTURES): VITD: 16.3 ng/mL — ABNORMAL LOW (ref 30.00–100.00)

## 2016-12-15 LAB — HEPATIC FUNCTION PANEL
ALBUMIN: 4.2 g/dL (ref 3.5–5.2)
ALK PHOS: 55 U/L (ref 39–117)
ALT: 21 U/L (ref 0–53)
AST: 19 U/L (ref 0–37)
BILIRUBIN TOTAL: 0.5 mg/dL (ref 0.2–1.2)
Bilirubin, Direct: 0.1 mg/dL (ref 0.0–0.3)
Total Protein: 7.1 g/dL (ref 6.0–8.3)

## 2016-12-15 LAB — HEMOGLOBIN A1C: HEMOGLOBIN A1C: 6.7 % — AB (ref 4.6–6.5)

## 2016-12-15 LAB — TSH: TSH: 2.75 u[IU]/mL (ref 0.35–4.50)

## 2016-12-15 LAB — PSA: PSA: 11.41 ng/mL — ABNORMAL HIGH (ref 0.10–4.00)

## 2016-12-15 MED ORDER — FLUTICASONE PROPIONATE 50 MCG/ACT NA SUSP: 2.0000 | Freq: Every day | NASAL | 3 refills | Status: DC

## 2016-12-15 MED ORDER — ERGOCALCIFEROL 1.25 MG (50000 UT) PO CAPS: 50000.0000 [IU] | ORAL_CAPSULE | ORAL | 11 refills | Status: DC

## 2016-12-15 NOTE — Progress Notes (Signed)
Subjective:    Patient ID: Jeffrey Spears, male    DOB: 09/23/1946, 71 y.o.   MRN: [id number]  HPI  Patient presents for yearly follow up exam. He is a pleasant 71 year old male who  has a past medical history of Anxiety; Colon polyp; Diabetes mellitus ([place]); Hyperlipidemia; Seasonal allergies; and Vitamin D deficiency.  All immunizations and health maintenance protocols were reviewed with the patient and needed orders were placed.  Appropriate screening laboratory values were ordered for the patient including screening of hyperlipidemia, renal function and hepatic function.  Medication reconciliation,  past medical history, social history, problem list and allergies were reviewed in detail with the patient  Goals were established with regard to weight loss, exercise, and  diet in compliance with medications. He goes to the [facility] multiple times per week and he tries to eat a heart healthy diet.   End of life planning was discussed. He has a living will and POA  He gets colonoscopies every 3 years. He is due this year. He has seen by his eye doctor and had his dental exam  He is a diabetic but his blood sugars are diet controlled.   He takes Zoloft for anxiety and feels as though he is well controlled on this   He also takes Lipitor for hyperlipidemia      Review of Systems  Constitutional: Negative.   HENT: Negative.   Eyes: Negative.   Respiratory: Negative.   Cardiovascular: Negative.   Gastrointestinal: Negative.   Endocrine: Negative.   Genitourinary: Negative.   Musculoskeletal: Negative.   Skin: Negative.   Allergic/Immunologic: Negative.   Neurological: Negative.   Hematological: Negative.   Psychiatric/Behavioral: Negative.   All other systems reviewed and are negative.  Past Medical History:  Diagnosis Date  . Anxiety   . Colon polyp   . Diabetes mellitus ([place])   . Hyperlipidemia   . Seasonal allergies   . Vitamin D deficiency     Social History    Social History  . Marital status: Married    Spouse name: N/A  . Number of children: N/A  . Years of education: N/A   Occupational History  . Not on file.   Social History Main Topics  . Smoking status: Never Smoker  . Smokeless tobacco: Not on file  . Alcohol use 0.0 oz/week  . Drug use: No  . Sexual activity: Yes   Other Topics Concern  . Not on file   Social History Narrative   Retired from Licensed conveyancer in [place]    Married    [place] to [place] in May to be closer to family.        Past Surgical History:  Procedure Laterality Date  . CATARACT EXTRACTION  2014   bilateral   . CERVICAL SPINE SURGERY  2003   herniated disk   . HERNIA REPAIR    . PARS PLANA VITRECTOMY W/ REPAIR OF MACULAR HOLE  2013  . ROTATOR CUFF REPAIR  2006   left shoulder  . VASECTOMY     1982    Family History  Problem Relation Age of Onset  . Emphysema Mother   . Heart disease Father   . Stroke Brother   . Alzheimer's disease Brother   . Alzheimer's disease Maternal Grandmother     No Known Allergies  Current Outpatient Prescriptions on File Prior to Visit  Medication Sig Dispense Refill  . ACCU-CHEK SOFTCLIX LANCETS lancets by Other route. Check once daily.    Marland Kitchen  atorvastatin (LIPITOR) 40 MG tablet TAKE 1 TABLET BY MOUTH  DAILY 90 tablet 0  . ergocalciferol (VITAMIN D2) 50000 UNITS capsule Take 50,000 Units by mouth once a week.    . fluticasone (FLONASE) 50 MCG/ACT nasal spray Place 2 sprays into both nostrils daily. 48 g 3  . glucose blood (ACCU-CHEK AVIVA PLUS) test strip 1 each by Other route daily. Use as instructed 100 each 3  . sertraline (ZOLOFT) 100 MG tablet Take 1.5 tablets (150 mg total) by mouth daily. 135 tablet 1  . tamsulosin (FLOMAX) 0.4 MG CAPS capsule TAKE 1 CAPSULE BY MOUTH TWO TIMES DAILY 180 capsule 0   No current facility-administered medications on file prior to visit.     There were no vitals taken for this visit.      Objective:    Physical Exam  Constitutional: He is oriented to person, place, and time. He appears well-developed and well-nourished. No distress.  HENT:  Head: Normocephalic and atraumatic.  Right Ear: External ear normal.  Left Ear: External ear normal.  Nose: Nose normal.  Mouth/Throat: Oropharynx is clear and moist. No oropharyngeal exudate.  Eyes: Conjunctivae are normal. Pupils are equal, round, and reactive to light. Right eye exhibits no discharge. Left eye exhibits no discharge. No scleral icterus.  Neck: Normal range of motion. Neck supple. No JVD present. No tracheal deviation present. No thyromegaly present.  Cardiovascular: Normal rate, regular rhythm, normal heart sounds and intact distal pulses.  Exam reveals no gallop and no friction rub.   No murmur heard. Pulmonary/Chest: Effort normal and breath sounds normal. No stridor. No respiratory distress. He has no wheezes. He has no rales. He exhibits no tenderness.  Abdominal: Soft. Bowel sounds are normal. He exhibits no distension and no mass. There is no tenderness. There is no rebound and no guarding.  Musculoskeletal: Normal range of motion. He exhibits no edema, tenderness or deformity.  Lymphadenopathy:    He has no cervical adenopathy.  Neurological: He is alert and oriented to person, place, and time. No cranial nerve deficit. Coordination normal.  Skin: Skin is warm and dry. No rash noted. He is not diaphoretic. No erythema. No pallor.  Psychiatric: He has a normal mood and affect. His behavior is normal. Judgment and thought content normal.  Nursing note and vitals reviewed.     Assessment & Plan:  1. Controlled type 2 diabetes mellitus without complication, without long-term current use of insulin (HCC)  - Basic metabolic panel - CBC with Differential/Platelet - Hemoglobin A1c - Hepatic function panel - Lipid panel - POCT Urinalysis Dipstick (Automated) - PSA - TSH - Consider adding metformin  - Continue to exercise and  eat healthy   2. BPH with urinary obstruction - Continue with flomax daily  - Basic metabolic panel - CBC with Differential/Platelet - Hemoglobin A1c - Hepatic function panel - Lipid panel - POCT Urinalysis Dipstick (Automated) - PSA - TSH  3. Hyperlipidemia, unspecified hyperlipidemia type  - Basic metabolic panel - CBC with Differential/Platelet - Hemoglobin A1c - Hepatic function panel - Lipid panel - POCT Urinalysis Dipstick (Automated) - PSA - TSH - Consider changing dose of lipitor  4. Vitamin D deficiency  - Basic metabolic panel - CBC with Differential/Platelet - Hemoglobin A1c - Hepatic function panel - Lipid panel - POCT Urinalysis Dipstick (Automated) - PSA - TSH - Vitamin D, 25-hydroxy  5. Colon cancer screening  - Ambulatory referral to Gastroenterology  Dorothyann Peng, NP

## 2016-12-16 ENCOUNTER — Encounter: Payer: Self-pay | Admitting: Gastroenterology

## 2016-12-22 ENCOUNTER — Other Ambulatory Visit: Payer: Self-pay

## 2017-01-16 ENCOUNTER — Telehealth: Payer: Self-pay | Admitting: Gastroenterology

## 2017-01-16 ENCOUNTER — Ambulatory Visit (AMBULATORY_SURGERY_CENTER): Payer: Self-pay

## 2017-01-16 ENCOUNTER — Telehealth: Payer: Self-pay

## 2017-01-16 VITALS — Ht 68.0 in | Wt 197.4 lb

## 2017-01-16 DIAGNOSIS — Z86010 Personal history of colonic polyps: Secondary | ICD-10-CM

## 2017-01-16 MED ORDER — NA SULFATE-K SULFATE-MG SULF 17.5-3.13-1.6 GM/177ML PO SOLN: 1.0000 | Freq: Once | ORAL | 0 refills | Status: AC

## 2017-01-16 NOTE — Telephone Encounter (Signed)
Faxed release to Dr. Mervin Kung to obtain past GI records.  Will give to Dr. Loletha Carrow to review when they are received so patient can be rescheduled for the procedure or an office visit.

## 2017-01-16 NOTE — Telephone Encounter (Signed)
Patient called back with necessary medical records information. Magda Paganini spoke with the patient, and she will send records request to the patients previous provider in  [place]. Records will be reviewed by Dr. Loletha Carrow.   Riki Sheer, LPN

## 2017-01-16 NOTE — Progress Notes (Signed)
Denies allergies to eggs or soy products. Denies complication of anesthesia or sedation. Denies use of weight loss medication. Denies use of O2.   Emmi instructions given for colonoscopy.   Records release signed by patient on 01/16/17 in Pre- Visit. Released to Magdalene River CMA as per protocol. Patient is going to call back with information regarding previous physician in [place]. So we can fax release. Patient informed that we will cancel colonoscopy on 02/06/17 and reschedule once medical records are received and reviewed by Dr. Loletha Carrow.

## 2017-01-26 ENCOUNTER — Telehealth: Payer: Self-pay | Admitting: Gastroenterology

## 2017-01-26 NOTE — Telephone Encounter (Signed)
Patient's 08/05/14 colonoscopy and pathology reports from [facility], [place] shows 5 tubular adenomas, (one over 38mm in size) and a hyperplastic polyp.  Please schedule him an [place] visit to direct book a colonoscopy for History of Colon Polyps

## 2017-01-26 NOTE — Telephone Encounter (Signed)
Pt was seen by previsit on 01-19-2017.Colonoscopy was scheduled for 02-06-2017. Left a message to return call to reschedule colonoscopy.

## 2017-01-30 ENCOUNTER — Telehealth: Payer: Self-pay | Admitting: Gastroenterology

## 2017-01-30 NOTE — Telephone Encounter (Signed)
New instructions printed and mailed.  LMOM for pt.

## 2017-02-02 ENCOUNTER — Other Ambulatory Visit: Payer: Self-pay | Admitting: Adult Health

## 2017-02-02 DIAGNOSIS — F411 Generalized anxiety disorder: Secondary | ICD-10-CM

## 2017-02-02 DIAGNOSIS — N401 Enlarged prostate with lower urinary tract symptoms: Secondary | ICD-10-CM

## 2017-02-02 DIAGNOSIS — N138 Other obstructive and reflux uropathy: Secondary | ICD-10-CM

## 2017-02-02 NOTE — Telephone Encounter (Signed)
Pt has been scheduled for 03-09-17. Levada Dy in previsit has sent new instructions to the pt

## 2017-02-02 NOTE — Telephone Encounter (Signed)
Zoloft for 6 months. All others for a year

## 2017-02-06 ENCOUNTER — Encounter: Payer: Medicare Other | Admitting: Gastroenterology

## 2017-03-09 ENCOUNTER — Ambulatory Visit (AMBULATORY_SURGERY_CENTER): Payer: Medicare Other | Admitting: Gastroenterology

## 2017-03-09 ENCOUNTER — Encounter: Payer: Self-pay | Admitting: Gastroenterology

## 2017-03-09 VITALS — BP 130/78 | HR 65 | Temp 97.1°F | Resp 19 | Ht 68.0 in | Wt 197.0 lb

## 2017-03-09 DIAGNOSIS — D122 Benign neoplasm of ascending colon: Secondary | ICD-10-CM

## 2017-03-09 DIAGNOSIS — Z86010 Personal history of colonic polyps: Secondary | ICD-10-CM

## 2017-03-09 DIAGNOSIS — K635 Polyp of colon: Secondary | ICD-10-CM

## 2017-03-09 DIAGNOSIS — E119 Type 2 diabetes mellitus without complications: Secondary | ICD-10-CM | POA: Diagnosis not present

## 2017-03-09 DIAGNOSIS — D124 Benign neoplasm of descending colon: Secondary | ICD-10-CM

## 2017-03-09 DIAGNOSIS — F419 Anxiety disorder, unspecified: Secondary | ICD-10-CM | POA: Diagnosis not present

## 2017-03-09 MED ORDER — SODIUM CHLORIDE 0.9 % IV SOLN: 500.0000 mL | INTRAVENOUS | Status: DC

## 2017-03-09 NOTE — Progress Notes (Signed)
Called to room to assist during endoscopic procedure.  Patient ID and intended procedure confirmed with present staff. Received instructions for my participation in the procedure from the performing physician.  

## 2017-03-09 NOTE — Patient Instructions (Signed)
Discharge instructions given. Handouts on polyps,diverticulosis and hemorrhoids. Resume previous medications. YOU HAD AN ENDOSCOPIC PROCEDURE TODAY AT THE [facility]:   Refer to the procedure report that was given to you for any specific questions about what was found during the examination.  If the procedure report does not answer your questions, please call your gastroenterologist to clarify.  If you requested that your care partner not be given the details of your procedure findings, then the procedure report has been included in a sealed envelope for you to review at your convenience later.  YOU SHOULD EXPECT: Some feelings of bloating in the abdomen. Passage of more gas than usual.  Walking can help get rid of the air that was put into your GI tract during the procedure and reduce the bloating. If you had a lower endoscopy (such as a colonoscopy or flexible sigmoidoscopy) you may notice spotting of blood in your stool or on the toilet paper. If you underwent a bowel prep for your procedure, you may not have a normal bowel movement for a few days.  Please Note:  You might notice some irritation and congestion in your nose or some drainage.  This is from the oxygen used during your procedure.  There is no need for concern and it should clear up in a day or so.  SYMPTOMS TO REPORT IMMEDIATELY:   Following lower endoscopy (colonoscopy or flexible sigmoidoscopy):  Excessive amounts of blood in the stool  Significant tenderness or worsening of abdominal pains  Swelling of the abdomen that is new, acute  Fever of 100F or higher   For urgent or emergent issues, a gastroenterologist can be reached at any hour by calling [phone].   DIET:  We do recommend a small meal at first, but then you may proceed to your regular diet.  Drink plenty of fluids but you should avoid alcoholic beverages for 24 hours.  ACTIVITY:  You should plan to take it easy for the rest of today and you  should NOT DRIVE or use heavy machinery until tomorrow (because of the sedation medicines used during the test).    FOLLOW UP: Our staff will call the number listed on your records the next business day following your procedure to check on you and address any questions or concerns that you may have regarding the information given to you following your procedure. If we do not reach you, we will leave a message.  However, if you are feeling well and you are not experiencing any problems, there is no need to return our call.  We will assume that you have returned to your regular daily activities without incident.  If any biopsies were taken you will be contacted by phone or by letter within the next 1-3 weeks.  Please call us at [phone] if you have not heard about the biopsies in 3 weeks.    SIGNATURES/CONFIDENTIALITY: You and/or your care partner have signed paperwork which will be entered into your electronic medical record.  These signatures attest to the fact that that the information above on your After Visit Summary has been reviewed and is understood.  Full responsibility of the confidentiality of this discharge information lies with you and/or your care-partner. 

## 2017-03-09 NOTE — Progress Notes (Signed)
Report given to PACU, vss 

## 2017-03-09 NOTE — Op Note (Signed)
[facility] Patient Name: Jeffrey Spears Procedure Date: 03/09/2017 1:37 PM MRN: [id number] Endoscopist: Mallie Mussel L. Loletha Carrow , MD Age: 71 Referring MD:  Date of Birth: 05/14/1946 Gender: Male Account #: [id number] Procedure:                Colonoscopy Indications:              Surveillance: Personal history of adenomatous                            polyps on last colonoscopy 3 years ago Medicines:                Monitored Anesthesia Care Procedure:                Pre-Anesthesia Assessment:                           - Prior to the procedure, a History and Physical                            was performed, and patient medications and                            allergies were reviewed. The patient's tolerance of                            previous anesthesia was also reviewed. The risks                            and benefits of the procedure and the sedation                            options and risks were discussed with the patient.                            All questions were answered, and informed consent                            was obtained. Prior Anticoagulants: The patient has                            taken no previous anticoagulant or antiplatelet                            agents. ASA Grade Assessment: II - A patient with                            mild systemic disease. After reviewing the risks                            and benefits, the patient was deemed in                            satisfactory condition to undergo the procedure.  After obtaining informed consent, the colonoscope                            was passed under direct vision. Throughout the                            procedure, the patient's blood pressure, pulse, and                            oxygen saturations were monitored continuously. The                            Colonoscope was introduced through the anus and                            advanced to the the cecum,  identified by                            appendiceal orifice and ileocecal valve. The                            colonoscopy was performed without difficulty. The                            patient tolerated the procedure well. The quality                            of the bowel preparation was good. The ileocecal                            valve, appendiceal orifice, and rectum were                            photographed. The quality of the bowel preparation                            was evaluated using the BBPS [facility] Bowel                            Preparation Scale) with scores of: Right Colon = 2,                            Transverse Colon = 2 and Left Colon = 2. The total                            BBPS score equals 6. The bowel preparation used was                            SUPREP. Scope In: 1:44:01 PM Scope Out: 1:55:12 PM Scope Withdrawal Time: 0 hours 9 minutes 17 seconds  Total Procedure Duration: 0 hours 11 minutes 11 seconds  Findings:                 The perianal and digital rectal  examinations were                            normal.                           Two sessile polyps were found in the descending                            colon and ascending colon. The polyps were 2 to 4                            mm in size. These polyps were removed with a cold                            snare. Resection and retrieval were complete.                           Multiple medium-mouthed diverticula were found in                            the entire colon.                           Internal hemorrhoids were found. The hemorrhoids                            were medium-sized and Grade I (internal hemorrhoids                            that do not prolapse).                           The exam was otherwise without abnormality on                            direct and retroflexion views. Complications:            No immediate complications. Estimated Blood Loss:     Estimated  blood loss: none. Impression:               - Two 2 to 4 mm polyps in the descending colon and                            in the ascending colon, removed with a cold snare.                            Resected and retrieved.                           - Diverticulosis in the entire examined colon.                           - Internal hemorrhoids.                           -  The examination was otherwise normal on direct                            and retroflexion views. Recommendation:           - Patient has a contact number available for                            emergencies. The signs and symptoms of potential                            delayed complications were discussed with the                            patient. Return to normal activities tomorrow.                            Written discharge instructions were provided to the                            patient.                           - Resume previous diet.                           - Continue present medications.                           - Await pathology results.                           - Repeat colonoscopy is recommended for                            surveillance. The colonoscopy date will be                            determined after pathology results from today's                            exam become available for review. Henry L. Loletha Carrow, MD 03/09/2017 2:00:30 PM This report has been signed electronically.

## 2017-03-10 ENCOUNTER — Telehealth: Payer: Self-pay | Admitting: *Deleted

## 2017-03-10 NOTE — Telephone Encounter (Signed)
  Follow up Call-  Call back number 03/09/2017  Post procedure Call Back phone  # [phone]  Wife  Permission to leave phone message Yes     Patient questions:  Do you have a fever, pain , or abdominal swelling? No. Pain Score  0 *  Have you tolerated food without any problems? Yes.    Have you been able to return to your normal activities? Yes.    Do you have any questions about your discharge instructions: Diet   No. Medications  No. Follow up visit  No.  Do you have questions or concerns about your Care? No.  Actions: * If pain score is 4 or above: No action needed, pain <4.

## 2017-03-14 ENCOUNTER — Encounter: Payer: Self-pay | Admitting: Gastroenterology

## 2017-05-21 ENCOUNTER — Other Ambulatory Visit: Payer: Self-pay | Admitting: Adult Health

## 2017-05-21 DIAGNOSIS — F411 Generalized anxiety disorder: Secondary | ICD-10-CM

## 2017-05-23 NOTE — Telephone Encounter (Signed)
   Subjective:    Patient ID: Jeffrey Spears, male    DOB: 1946/09/24, 71 y.o.   MRN: [id number]  HPI  Patient presents for yearly follow up exam. He is a pleasant 71 year old male who  has a past medical history of Anxiety; Colon polyp; Diabetes mellitus ([place]); Hyperlipidemia; Seasonal allergies; and Vitamin D deficiency.  All immunizations and health maintenance protocols were reviewed with the patient and needed orders were placed.  Appropriate screening laboratory values were ordered for the patient including screening of hyperlipidemia, renal function and hepatic function.  Medication reconciliation,  past medical history, social history, problem list and allergies were reviewed in detail with the patient  Goals were established with regard to weight loss, exercise, and  diet in compliance with medications. He goes to the [facility] multiple times per week and he tries to eat a heart healthy diet.   End of life planning was discussed. He has a living will and POA  He gets colonoscopies every 3 years. He is due this year. He has seen by his eye doctor and had his dental exam  He is a diabetic but his blood sugars are diet controlled.   He takes Zoloft for anxiety and feels as though he is well controlled on this   He also takes Lipitor for hyperlipidemia    **Sent to the pharmacy by e-scribe for 6 months**

## 2017-06-23 ENCOUNTER — Encounter: Payer: Self-pay | Admitting: Adult Health

## 2017-11-27 ENCOUNTER — Other Ambulatory Visit: Payer: Self-pay | Admitting: Adult Health

## 2017-11-27 DIAGNOSIS — F411 Generalized anxiety disorder: Secondary | ICD-10-CM

## 2017-11-29 NOTE — Telephone Encounter (Signed)
Sent to pharmacy by e-scribe.  Pt now scheduled for cpx 12/22/17 @ 8:30.

## 2017-12-22 ENCOUNTER — Encounter: Payer: Self-pay | Admitting: Adult Health

## 2017-12-22 ENCOUNTER — Ambulatory Visit (INDEPENDENT_AMBULATORY_CARE_PROVIDER_SITE_OTHER): Payer: Medicare Other | Admitting: Adult Health

## 2017-12-22 VITALS — BP 142/80 | Temp 97.7°F | Ht 67.0 in | Wt 188.0 lb

## 2017-12-22 DIAGNOSIS — N401 Enlarged prostate with lower urinary tract symptoms: Secondary | ICD-10-CM

## 2017-12-22 DIAGNOSIS — N138 Other obstructive and reflux uropathy: Secondary | ICD-10-CM

## 2017-12-22 DIAGNOSIS — E119 Type 2 diabetes mellitus without complications: Secondary | ICD-10-CM

## 2017-12-22 DIAGNOSIS — E785 Hyperlipidemia, unspecified: Secondary | ICD-10-CM

## 2017-12-22 DIAGNOSIS — E559 Vitamin D deficiency, unspecified: Secondary | ICD-10-CM

## 2017-12-22 LAB — CBC WITH DIFFERENTIAL/PLATELET
BASOS ABS: 0 10*3/uL (ref 0.0–0.1)
Basophils Relative: 0.3 % (ref 0.0–3.0)
EOS PCT: 3.7 % (ref 0.0–5.0)
Eosinophils Absolute: 0.3 10*3/uL (ref 0.0–0.7)
HCT: 40.7 % (ref 39.0–52.0)
Hemoglobin: 13.4 g/dL (ref 13.0–17.0)
LYMPHS ABS: 1 10*3/uL (ref 0.7–4.0)
Lymphocytes Relative: 14.1 % (ref 12.0–46.0)
MCHC: 33 g/dL (ref 30.0–36.0)
MCV: 91.8 fl (ref 78.0–100.0)
MONOS PCT: 7.8 % (ref 3.0–12.0)
Monocytes Absolute: 0.5 10*3/uL (ref 0.1–1.0)
NEUTROS ABS: 5.1 10*3/uL (ref 1.4–7.7)
NEUTROS PCT: 74.1 % (ref 43.0–77.0)
PLATELETS: 230 10*3/uL (ref 150.0–400.0)
RBC: 4.44 Mil/uL (ref 4.22–5.81)
RDW: 13.6 % (ref 11.5–15.5)
WBC: 6.8 10*3/uL (ref 4.0–10.5)

## 2017-12-22 LAB — COMPREHENSIVE METABOLIC PANEL
ALT: 18 U/L (ref 0–53)
AST: 20 U/L (ref 0–37)
Albumin: 4.1 g/dL (ref 3.5–5.2)
Alkaline Phosphatase: 64 U/L (ref 39–117)
BUN: 22 mg/dL (ref 6–23)
CHLORIDE: 103 meq/L (ref 96–112)
CO2: 28 meq/L (ref 19–32)
Calcium: 8.9 mg/dL (ref 8.4–10.5)
Creatinine, Ser: 1.08 mg/dL (ref 0.40–1.50)
GFR: 71.43 mL/min (ref >60.00)
GLUCOSE: 123 mg/dL — AB (ref 70–99)
Potassium: 4.3 mEq/L (ref 3.5–5.1)
Sodium: 140 mEq/L (ref 135–145)
Total Bilirubin: 0.5 mg/dL (ref 0.2–1.2)
Total Protein: 7.1 g/dL (ref 6.0–8.3)

## 2017-12-22 LAB — PSA: PSA: 14.67 ng/mL — ABNORMAL HIGH (ref 0.10–4.00)

## 2017-12-22 LAB — LIPID PANEL
CHOL/HDL RATIO: 3
Cholesterol: 165 mg/dL (ref 0–200)
HDL: 58 mg/dL (ref >39.00)
LDL Cholesterol: 93 mg/dL (ref 0–99)
NonHDL: 107
TRIGLYCERIDES: 70 mg/dL (ref 0.0–149.0)
VLDL: 14 mg/dL (ref 0.0–40.0)

## 2017-12-22 LAB — VITAMIN D 25 HYDROXY (VIT D DEFICIENCY, FRACTURES): VITD: 16.05 ng/mL — ABNORMAL LOW (ref 30.00–100.00)

## 2017-12-22 LAB — TSH: TSH: 1.37 u[IU]/mL (ref 0.35–4.50)

## 2017-12-22 LAB — HEMOGLOBIN A1C: Hgb A1c MFr Bld: 6.6 % — ABNORMAL HIGH (ref 4.6–6.5)

## 2017-12-22 NOTE — Progress Notes (Signed)
Subjective:    Patient ID: Jeffrey Spears, male    DOB: May 22, 1946, 72 y.o.   MRN: [id number]  HPI Patient presents for yearly preventative medicine examination. He is a pleasant 72 year old male who  has a past medical history of Allergy, Anxiety, Colon polyp, Diabetes mellitus ([place]), Hyperlipidemia, Seasonal allergies, and Vitamin D deficiency.  He has history of diabetes which is currently diet controlled.  I have not seen him for approximately 1 year at which time his A1c was 6.7 Lab Results  Component Value Date   HGBA1C 6.7 (H) 12/15/2016   He has a history of hyperlipidemia for which he takes Lipitor on a daily basis Lab Results  Component Value Date   CHOL 181 12/15/2016   HDL 56.80 12/15/2016   LDLCALC 103 (H) 12/15/2016   TRIG 105.0 12/15/2016   CHOLHDL 3 12/15/2016   He takes Zoloft for anxiety feels as though his anxiety is stable  Additionally, he has a history of BPH for which he takes Flomax 0.4 mg.  He feels as though this is stable  All immunizations and health maintenance protocols were reviewed with the patient and needed orders were placed.  Appropriate screening laboratory values were ordered for the patient including screening of hyperlipidemia, renal function and hepatic function. If indicated by BPH, a PSA was ordered.  Medication reconciliation,  past medical history, social history, problem list and allergies were reviewed in detail with the patient  Goals were established with regard to weight loss, exercise, and  diet in compliance with medications.  He is walking and working around the yard, he tries to eat a heart healthy diet  End of life planning was discussed.  He has a living will and healthcare power of attorney  He had a colonoscopy in 2018 which was normal except for 2 sessile polyps. He is  up-to-date on his vision and dental screens  He denies any acute complaints. He reports " I feel really good"  Review of Systems  Constitutional:  Negative.   HENT: Positive for hearing loss.   Eyes: Negative.   Respiratory: Negative.   Cardiovascular: Negative.   Gastrointestinal: Negative.   Endocrine: Negative.   Genitourinary: Negative.   Musculoskeletal: Negative.   Skin: Negative.   Allergic/Immunologic: Negative.   Neurological: Negative.   Hematological: Negative.   Psychiatric/Behavioral: Negative.   All other systems reviewed and are negative.    Past Medical History:  Diagnosis Date  . Allergy   . Anxiety   . Colon polyp   . Diabetes mellitus ([place])   . Hyperlipidemia   . Seasonal allergies   . Vitamin D deficiency     Social History   Socioeconomic History  . Marital status: Married    Spouse name: Not on file  . Number of children: Not on file  . Years of education: Not on file  . Highest education level: Not on file  Occupational History  . Not on file  Social Needs  . Financial resource strain: Not on file  . Food insecurity:    Worry: Not on file    Inability: Not on file  . Transportation needs:    Medical: Not on file    Non-medical: Not on file  Tobacco Use  . Smoking status: Never Smoker  . Smokeless tobacco: Never Used  Substance and Sexual Activity  . Alcohol use: Yes    Alcohol/week: 0.0 oz    Comment: one drink nightly   . Drug use:  No  . Sexual activity: Yes  Lifestyle  . Physical activity:    Days per week: Not on file    Minutes per session: Not on file  . Stress: Not on file  Relationships  . Social connections:    Talks on phone: Not on file    Gets together: Not on file    Attends religious service: Not on file    Active member of club or organization: Not on file    Attends meetings of clubs or organizations: Not on file    Relationship status: Not on file  . Intimate partner violence:    Fear of current or ex partner: Not on file    Emotionally abused: Not on file    Physically abused: Not on file    Forced sexual activity: Not on file  Other Topics Concern    . Not on file  Social History Narrative   Retired from Licensed conveyancer in [place]    Married    [place] to [place] in May to be closer to family.     Past Surgical History:  Procedure Laterality Date  . CATARACT EXTRACTION  2014   bilateral   . CERVICAL SPINE SURGERY  2003   herniated disk   . COLONOSCOPY    . HERNIA REPAIR    . PARS PLANA VITRECTOMY W/ REPAIR OF MACULAR HOLE  2013  . POLYPECTOMY    . ROTATOR CUFF REPAIR  2006   left shoulder  . VASECTOMY     1982    Family History  Problem Relation Age of Onset  . Emphysema Mother   . Heart disease Father   . Stroke Brother   . Alzheimer's disease Brother   . Alzheimer's disease Maternal Grandmother   . Colon cancer Neg Hx   . Esophageal cancer Neg Hx   . Rectal cancer Neg Hx   . Stomach cancer Neg Hx     No Known Allergies  Current Outpatient Medications on File Prior to Visit  Medication Sig Dispense Refill  . atorvastatin (LIPITOR) 40 MG tablet TAKE 1 TABLET BY MOUTH  DAILY 90 tablet 3  . fluticasone (FLONASE) 50 MCG/ACT nasal spray Place 2 sprays into both nostrils daily. 48 g 3  . sertraline (ZOLOFT) 100 MG tablet TAKE 1 AND 1/2 TABLETS BY  MOUTH DAILY 135 tablet 0  . tamsulosin (FLOMAX) 0.4 MG CAPS capsule TAKE 1 CAPSULE BY MOUTH TWO TIMES DAILY 180 capsule 3   Current Facility-Administered Medications on File Prior to Visit  Medication Dose Route Frequency Provider Last Rate Last Dose  . 0.9 %  sodium chloride infusion  500 mL Intravenous Continuous Danis, Estill Cotta III, MD        BP (!) 142/80   Temp 97.7 F (36.5 C) (Oral)   Ht 5\' 7"  (1.702 m) Comment: Without Shoes  Wt 188 lb (85.3 kg)   BMI 29.44 kg/m       Objective:   Physical Exam  Constitutional: He is oriented to person, place, and time. He appears well-developed and well-nourished. No distress.  HENT:  Head: Normocephalic and atraumatic.  Right Ear: External ear normal.  Left Ear: External ear normal.  Nose: Nose normal.   Mouth/Throat: Oropharynx is clear and moist. No oropharyngeal exudate.  Eyes: Pupils are equal, round, and reactive to light. Conjunctivae and EOM are normal. Right eye exhibits no discharge. Left eye exhibits no discharge. No scleral icterus.  Neck: Normal range of motion. Neck supple. No  JVD present. No tracheal deviation present. No thyromegaly present.  Cardiovascular: Normal rate, regular rhythm, normal heart sounds and intact distal pulses. Exam reveals no gallop and no friction rub.  No murmur heard. Pulmonary/Chest: Effort normal and breath sounds normal. No stridor. No respiratory distress. He has no wheezes. He has no rales. He exhibits no tenderness.  Abdominal: Soft. Bowel sounds are normal. He exhibits no distension and no mass. There is no tenderness. There is no rebound and no guarding.  Musculoskeletal: Normal range of motion. He exhibits no edema, tenderness or deformity.  Lymphadenopathy:    He has no cervical adenopathy.  Neurological: He is alert and oriented to person, place, and time. He has normal reflexes. He displays normal reflexes. No cranial nerve deficit. He exhibits normal muscle tone. Coordination normal.  Skin: Skin is warm and dry. No rash noted. He is not diaphoretic. No erythema. No pallor.  Psychiatric: He has a normal mood and affect. His behavior is normal. Judgment and thought content normal.  Nursing note and vitals reviewed.     Assessment & Plan:  1. Controlled type 2 diabetes mellitus without complication, without long-term current use of insulin ([place]) - Consider medication - Follow up in 6 months or sooner if needed - CBC with Differential/Platelet - Comprehensive metabolic panel - Hemoglobin A1c - Lipid panel - TSH  2. BPH with urinary obstruction - Continue with current treatment  - CBC with Differential/Platelet - Comprehensive metabolic panel - Hemoglobin A1c - Lipid panel - TSH - PSA  3. Hyperlipidemia, unspecified hyperlipidemia  type - Consider increasing statin  - CBC with Differential/Platelet - Comprehensive metabolic panel - Hemoglobin A1c - Lipid panel - TSH - PSA  4. Vitamin D deficiency  - Vitamin D, 25-hydroxy   Dorothyann Peng, NP

## 2017-12-28 ENCOUNTER — Other Ambulatory Visit: Payer: Self-pay | Admitting: Family Medicine

## 2017-12-28 DIAGNOSIS — R972 Elevated prostate specific antigen [PSA]: Secondary | ICD-10-CM

## 2018-04-20 ENCOUNTER — Other Ambulatory Visit: Payer: Self-pay | Admitting: Adult Health

## 2018-04-20 DIAGNOSIS — N138 Other obstructive and reflux uropathy: Secondary | ICD-10-CM

## 2018-04-20 DIAGNOSIS — N401 Enlarged prostate with lower urinary tract symptoms: Principal | ICD-10-CM

## 2018-04-20 DIAGNOSIS — F411 Generalized anxiety disorder: Secondary | ICD-10-CM

## 2018-04-20 NOTE — Telephone Encounter (Signed)
Sent to the pharmacy by e-scribe. 

## 2018-08-28 ENCOUNTER — Other Ambulatory Visit: Payer: Self-pay | Admitting: Adult Health

## 2018-08-28 DIAGNOSIS — F411 Generalized anxiety disorder: Secondary | ICD-10-CM

## 2018-08-28 NOTE — Telephone Encounter (Signed)
DENIED.  FILLED ON 04/20/18 FOR 6 MONTHS.  REQUEST IS TOO EARLY.

## 2018-10-20 ENCOUNTER — Other Ambulatory Visit: Payer: Self-pay | Admitting: Adult Health

## 2018-10-20 DIAGNOSIS — F411 Generalized anxiety disorder: Secondary | ICD-10-CM

## 2018-10-25 ENCOUNTER — Ambulatory Visit (INDEPENDENT_AMBULATORY_CARE_PROVIDER_SITE_OTHER): Payer: Medicare Other | Admitting: Adult Health

## 2018-10-25 ENCOUNTER — Encounter: Payer: Self-pay | Admitting: Adult Health

## 2018-10-25 VITALS — BP 130/84 | HR 63 | Temp 97.8°F | Ht 67.0 in | Wt 191.1 lb

## 2018-10-25 DIAGNOSIS — R972 Elevated prostate specific antigen [PSA]: Secondary | ICD-10-CM

## 2018-10-25 DIAGNOSIS — E119 Type 2 diabetes mellitus without complications: Secondary | ICD-10-CM | POA: Diagnosis not present

## 2018-10-25 LAB — BASIC METABOLIC PANEL
BUN: 19 mg/dL (ref 6–23)
CO2: 31 meq/L (ref 19–32)
CREATININE: 1.14 mg/dL (ref 0.40–1.50)
Calcium: 8.8 mg/dL (ref 8.4–10.5)
Chloride: 103 mEq/L (ref 96–112)
GFR: 62.99 mL/min (ref >60.00)
Glucose, Bld: 130 mg/dL — ABNORMAL HIGH (ref 70–99)
Potassium: 4.3 mEq/L (ref 3.5–5.1)
Sodium: 140 mEq/L (ref 135–145)

## 2018-10-25 LAB — HEMOGLOBIN A1C: HEMOGLOBIN A1C: 6.6 % — AB (ref 4.6–6.5)

## 2018-10-25 LAB — PSA: PSA: 15.52 ng/mL — ABNORMAL HIGH (ref 0.10–4.00)

## 2018-10-25 NOTE — Progress Notes (Signed)
Subjective:    Patient ID: Jeffrey Spears, male    DOB: 1945-10-18, 73 y.o.   MRN: [id number]  HPI 73 year old male who  has a past medical history of Allergy, Anxiety, Colon polyp, Diabetes mellitus ([place]), Hyperlipidemia, Seasonal allergies, and Vitamin D deficiency.  He presents to the office today for follow up regarding elevated PSA and DM.   PSA - his last PSA was 14.67 - 10 months ago. He was supposed to be referred to Urology ( has been seen by urology 3-4 [place], and had biopsies done, that were negative). He never received a call for Urology Appointment.   DM - Currently diet controlled. Does not check his blood sugars at home on a regular basis. Denies feeling of hypo or hyperglycemia  Lab Results  Component Value Date   HGBA1C 6.6 (H) 12/22/2017    Review of Systems See HPI   Past Medical History:  Diagnosis Date  . Allergy   . Anxiety   . Colon polyp   . Diabetes mellitus ([place])   . Hyperlipidemia   . Seasonal allergies   . Vitamin D deficiency     Social History   Socioeconomic History  . Marital status: Married    Spouse name: Not on file  . Number of children: Not on file  . Years of education: Not on file  . Highest education level: Not on file  Occupational History  . Not on file  Social Needs  . Financial resource strain: Not on file  . Food insecurity:    Worry: Not on file    Inability: Not on file  . Transportation needs:    Medical: Not on file    Non-medical: Not on file  Tobacco Use  . Smoking status: Never Smoker  . Smokeless tobacco: Never Used  Substance and Sexual Activity  . Alcohol use: Yes    Alcohol/week: 0.0 standard drinks    Comment: one drink nightly   . Drug use: No  . Sexual activity: Yes  Lifestyle  . Physical activity:    Days per week: Not on file    Minutes per session: Not on file  . Stress: Not on file  Relationships  . Social connections:    Talks on phone: Not on file    Gets together: Not  on file    Attends religious service: Not on file    Active member of club or organization: Not on file    Attends meetings of clubs or organizations: Not on file    Relationship status: Not on file  . Intimate partner violence:    Fear of current or ex partner: Not on file    Emotionally abused: Not on file    Physically abused: Not on file    Forced sexual activity: Not on file  Other Topics Concern  . 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     Past Surgical History:  Procedure Laterality Date  . CATARACT EXTRACTION  2014   bilateral   . CERVICAL SPINE SURGERY  2003   herniated disk   . COLONOSCOPY    . HERNIA REPAIR    . PARS PLANA VITRECTOMY W/ REPAIR OF MACULAR HOLE  2013  . POLYPECTOMY    . ROTATOR CUFF REPAIR  2006   left shoulder  .  VASECTOMY     1982    Family History  Problem Relation Age of Onset  . Emphysema Mother   . Heart disease Father   . Stroke Brother   . Alzheimer's disease Brother   . Alzheimer's disease Maternal Grandmother   . Colon cancer Neg Hx   . Esophageal cancer Neg Hx   . Rectal cancer Neg Hx   . Stomach cancer Neg Hx     No Known Allergies  Current Outpatient Medications on File Prior to Visit  Medication Sig Dispense Refill  . atorvastatin (LIPITOR) 40 MG tablet TAKE 1 TABLET BY MOUTH  DAILY 90 tablet 2  . fluticasone (FLONASE) 50 MCG/ACT nasal spray USE 2 SPRAYS IN EACH  NOSTRIL DAILY 48 g 2  . sertraline (ZOLOFT) 100 MG tablet TAKE 1 AND 1/2 TABLETS BY  MOUTH DAILY 135 tablet 1  . tamsulosin (FLOMAX) 0.4 MG CAPS capsule TAKE 1 CAPSULE BY MOUTH TWO TIMES DAILY 180 capsule 2   Current Facility-Administered Medications on File Prior to Visit  Medication Dose Route Frequency Provider Last Rate Last Dose  . 0.9 %  sodium chloride infusion  500 mL Intravenous Continuous Danis, Estill Cotta III, MD        BP 130/84 (BP  Location: Left Arm, Patient Position: Sitting, Cuff Size: Normal)   Pulse 63   Temp 97.8 F (36.6 C) (Oral)   Ht 5\' 7"  (1.702 m)   Wt 191 lb 2 oz (86.7 kg)   SpO2 94%   BMI 29.93 kg/m       Objective:   Physical Exam Vitals signs and nursing note reviewed.  Constitutional:      Appearance: Normal appearance.  Cardiovascular:     Rate and Rhythm: Normal rate and regular rhythm.     Pulses: Normal pulses.     Heart sounds: Normal heart sounds.  Pulmonary:     Effort: Pulmonary effort is normal.     Breath sounds: Normal breath sounds.  Skin:    General: Skin is warm.     Capillary Refill: Capillary refill takes less than 2 seconds.  Neurological:     Mental Status: He is alert.       Assessment & Plan:  1. Controlled type 2 diabetes mellitus without complication, without long-term current use of insulin ([place]) - Consider metformin  - Encouraged diet and exercise  - Follow up in March for CPE  - Hemoglobin U8H - Basic Metabolic Panel  2. Elevated PSA - Will refer to Urology  - PSA - Ambulatory referral to Urology   Dorothyann Peng, NP

## 2018-12-27 ENCOUNTER — Encounter: Payer: Medicare Other | Admitting: Adult Health

## 2019-03-20 ENCOUNTER — Encounter: Payer: Medicare Other | Admitting: Adult Health

## 2019-04-29 ENCOUNTER — Other Ambulatory Visit: Payer: Self-pay | Admitting: Adult Health

## 2019-04-29 DIAGNOSIS — F411 Generalized anxiety disorder: Secondary | ICD-10-CM

## 2019-04-29 DIAGNOSIS — N138 Other obstructive and reflux uropathy: Secondary | ICD-10-CM

## 2019-04-29 DIAGNOSIS — N401 Enlarged prostate with lower urinary tract symptoms: Secondary | ICD-10-CM

## 2019-04-30 ENCOUNTER — Encounter: Payer: Self-pay | Admitting: Family Medicine

## 2019-04-30 NOTE — Telephone Encounter (Signed)
90 days sent to the pharmacy by e-scribe.  Pt is past due for cpx.  Letter released to [employer].

## 2019-05-02 ENCOUNTER — Other Ambulatory Visit: Payer: Self-pay

## 2019-06-18 ENCOUNTER — Other Ambulatory Visit: Payer: Self-pay | Admitting: Adult Health

## 2019-06-18 DIAGNOSIS — N138 Other obstructive and reflux uropathy: Secondary | ICD-10-CM

## 2019-06-18 DIAGNOSIS — F411 Generalized anxiety disorder: Secondary | ICD-10-CM

## 2019-10-24 ENCOUNTER — Ambulatory Visit: Payer: Medicare Other | Attending: Internal Medicine

## 2019-10-24 DIAGNOSIS — Z23 Encounter for immunization: Secondary | ICD-10-CM

## 2019-10-24 NOTE — Progress Notes (Signed)
   Covid-19 Vaccination Clinic  Name:  Jeffrey Spears    MRN: [id number] DOB: 30-Jun-1946  10/24/2019  [name] was observed post Covid-19 immunization for 15 minutes without incidence. He was provided with Vaccine Information Sheet and instruction to access the V-Safe system.   [name] was instructed to call 911 with any severe reactions post vaccine: Marland Kitchen Difficulty breathing  . Swelling of your face and throat  . A fast heartbeat  . A bad rash all over your body  . Dizziness and weakness    Immunizations Administered    Name Date Dose VIS Date Route   Pfizer COVID-19 Vaccine 10/24/2019  9:05 AM 0.3 mL 09/13/2019 Intramuscular   Manufacturer: [place]   Lot: [id number]   [place]: [id number]

## 2019-10-25 ENCOUNTER — Other Ambulatory Visit: Payer: Self-pay | Admitting: Adult Health

## 2019-10-25 NOTE — Telephone Encounter (Signed)
Patient need to schedule an ov for more refills. 

## 2019-11-13 ENCOUNTER — Ambulatory Visit: Payer: Medicare Other | Attending: Internal Medicine

## 2019-11-13 DIAGNOSIS — Z23 Encounter for immunization: Secondary | ICD-10-CM | POA: Insufficient documentation

## 2019-11-13 NOTE — Progress Notes (Signed)
   Covid-19 Vaccination Clinic  Name:  RODRIGUES CUADRAS    MRN: [id number] DOB: Nov 03, 1945  11/13/2019  Mr. Kokal was observed post Covid-19 immunization for 15 minutes without incidence. He was provided with Vaccine Information Sheet and instruction to access the V-Safe system.   Mr. Dewater was instructed to call 911 with any severe reactions post vaccine: Marland Kitchen Difficulty breathing  . Swelling of your face and throat  . A fast heartbeat  . A bad rash all over your body  . Dizziness and weakness    Immunizations Administered    Name Date Dose VIS Date Route   Pfizer COVID-19 Vaccine 11/13/2019  1:37 PM 0.3 mL 09/13/2019 Intramuscular   Manufacturer: [place]   Lot: [id number]   [place]: [id number]

## 2020-01-26 ENCOUNTER — Other Ambulatory Visit: Payer: Self-pay | Admitting: Adult Health

## 2020-01-28 NOTE — Telephone Encounter (Signed)
AVAILABLE OTC.  NEEDS APPOINTMENT FOR CPX TO GET PRESCRIPTION REFILLS.

## 2020-03-17 ENCOUNTER — Ambulatory Visit (INDEPENDENT_AMBULATORY_CARE_PROVIDER_SITE_OTHER): Payer: Medicare Other | Admitting: Adult Health

## 2020-03-17 ENCOUNTER — Other Ambulatory Visit: Payer: Self-pay

## 2020-03-17 ENCOUNTER — Encounter: Payer: Self-pay | Admitting: Adult Health

## 2020-03-17 VITALS — BP 146/92 | Temp 97.2°F | Ht 68.0 in | Wt 192.0 lb

## 2020-03-17 DIAGNOSIS — R972 Elevated prostate specific antigen [PSA]: Secondary | ICD-10-CM

## 2020-03-17 DIAGNOSIS — E785 Hyperlipidemia, unspecified: Secondary | ICD-10-CM | POA: Diagnosis not present

## 2020-03-17 DIAGNOSIS — E559 Vitamin D deficiency, unspecified: Secondary | ICD-10-CM

## 2020-03-17 DIAGNOSIS — F411 Generalized anxiety disorder: Secondary | ICD-10-CM

## 2020-03-17 DIAGNOSIS — N401 Enlarged prostate with lower urinary tract symptoms: Secondary | ICD-10-CM

## 2020-03-17 DIAGNOSIS — N138 Other obstructive and reflux uropathy: Secondary | ICD-10-CM

## 2020-03-17 DIAGNOSIS — I10 Essential (primary) hypertension: Secondary | ICD-10-CM

## 2020-03-17 DIAGNOSIS — E119 Type 2 diabetes mellitus without complications: Secondary | ICD-10-CM | POA: Diagnosis not present

## 2020-03-17 MED ORDER — LISINOPRIL 2.5 MG PO TABS: 2.5000 mg | ORAL_TABLET | Freq: Every day | ORAL | 0 refills | Status: DC

## 2020-03-17 MED ORDER — FLUTICASONE PROPIONATE 50 MCG/ACT NA SUSP: 2.0000 | Freq: Every day | NASAL | 3 refills | Status: DC

## 2020-03-17 NOTE — Patient Instructions (Signed)
It was great seeing you today   Please call Urology today and set up an appointment   I have started you on a low dose of lisinopril  to help bring down your blood pressure. Monitor at home over the next two weeks and let me know what readings you are getting   Go to the basement in the [place] office for your blood work

## 2020-03-17 NOTE — Progress Notes (Signed)
Subjective:    Patient ID: Jeffrey Spears, male    DOB: [birth year], 74 y.o.   MRN: [id number]  HPI  Patient presents for yearly preventative medicine examination.He is a pleasant 75 year old who  has a past medical history of Allergy, Anxiety, Colon polyp, Diabetes mellitus ([place]), Hyperlipidemia, Seasonal allergies, and Vitamin D deficiency.  DM -diet controlled.  Lab Results  Component Value Date   HGBA1C 6.6 (H) 10/25/2018   Hyperlipidemia-takes Lipitor daily.  He denies myalgia or fatigue. Lab Results  Component Value Date   CHOL 165 12/22/2017   HDL 58.00 12/22/2017   LDLCALC 93 12/22/2017   TRIG 70.0 12/22/2017   CHOLHDL 3 12/22/2017    Anxiety -takes Zoloft, feels as though his anxiety is stable.  BPH/elevated PSA.  He takes Flomax 0.4 mg.  He has been referred to [facility] urology multiple times. He has been seen by Urology yet as his appointments were cancelled due to Covid.  Lab Results  Component Value Date   PSA 15.52 (H) 10/25/2018   PSA 14.67 (H) 12/22/2017   PSA 11.41 (H) 12/15/2016    All immunizations and health maintenance protocols were reviewed with the patient and needed orders were placed.  Appropriate screening laboratory values were ordered for the patient including screening of hyperlipidemia, renal function and hepatic function. If indicated by BPH, a PSA was ordered.  Medication reconciliation,  past medical history, social history, problem list and allergies were reviewed in detail with the patient  Goals were established with regard to weight loss, exercise, and  diet in compliance with medications. He is staying active and eating healthy  Wt Readings from Last 3 Encounters:  03/17/20 192 lb (87.1 kg)  10/25/18 191 lb 2 oz (86.7 kg)  12/22/17 188 lb (85.3 kg)   He is up-to-date on routine colon cancer screenings, he is on the 5-year plan due to colon polyps  He has no acute issues   Review of Systems  Constitutional: Negative.   HENT:  Negative.   Eyes: Negative.   Respiratory: Negative.   Cardiovascular: Negative.   Gastrointestinal: Negative.   Endocrine: Negative.   Genitourinary: Negative.   Musculoskeletal: Negative.   Skin: Negative.   Allergic/Immunologic: Negative.   Neurological: Negative.   Hematological: Negative.   Psychiatric/Behavioral: Negative.   All other systems reviewed and are negative.  Past Medical History:  Diagnosis Date  . Allergy   . Anxiety   . Colon polyp   . Diabetes mellitus ([place])   . Hyperlipidemia   . Seasonal allergies   . Vitamin D deficiency     Social History   Socioeconomic History  . Marital status: Married    Spouse name: Not on file  . Number of children: Not on file  . Years of education: Not on file  . Highest education level: Not on file  Occupational History  . Not on file  Tobacco Use  . Smoking status: Never Smoker  . Smokeless tobacco: Never Used  Vaping Use  . Vaping Use: Never used  Substance and Sexual Activity  . Alcohol use: Yes    Alcohol/week: 0.0 standard drinks    Comment: one drink nightly   . Drug use: No  . Sexual activity: Yes  Other Topics Concern  . Not on file  Social History Narrative   Retired from Licensed conveyancer in [place]    Married    [place] to [place] in May to be closer to family.  Social Determinants of Health   Financial Resource Strain:   . Difficulty of Paying Living Expenses:   Food Insecurity:   . Worried About Charity fundraiser in the Last Year:   . Arboriculturist in the Last Year:   Transportation Needs:   . Film/video editor (Medical):   Marland Kitchen Lack of Transportation (Non-Medical):   Physical Activity:   . Days of Exercise per Week:   . Minutes of Exercise per Session:   Stress:   . Feeling of Stress :   Social Connections:   . Frequency of Communication with Friends and Family:   . Frequency of Social Gatherings with Friends and Family:   . Attends Religious Services:   . Active Member  of Clubs or Organizations:   . Attends Archivist Meetings:   Marland Kitchen Marital Status:   Intimate Partner Violence:   . Fear of Current or Ex-Partner:   . Emotionally Abused:   Marland Kitchen Physically Abused:   . Sexually Abused:     Past Surgical History:  Procedure Laterality Date  . CATARACT EXTRACTION  2014   bilateral   . CERVICAL SPINE SURGERY  2003   herniated disk   . COLONOSCOPY    . HERNIA REPAIR    . PARS PLANA VITRECTOMY W/ REPAIR OF MACULAR HOLE  2013  . POLYPECTOMY    . ROTATOR CUFF REPAIR  2006   left shoulder  . VASECTOMY     1982    Family History  Problem Relation Age of Onset  . Emphysema Mother   . Heart disease Father   . Stroke Brother   . Alzheimer's disease Brother   . Alzheimer's disease Maternal Grandmother   . Colon cancer Neg Hx   . Esophageal cancer Neg Hx   . Rectal cancer Neg Hx   . Stomach cancer Neg Hx     No Known Allergies  Current Outpatient Medications on File Prior to Visit  Medication Sig Dispense Refill  . atorvastatin (LIPITOR) 40 MG tablet TAKE 1 TABLET BY MOUTH  DAILY 90 tablet 3  . ergocalciferol (VITAMIN D2) 1.25 MG (50000 UT) capsule Take by mouth.    . finasteride (PROSCAR) 5 MG tablet Take by mouth.    . fluticasone (FLONASE) 50 MCG/ACT nasal spray USE 2 SPRAYS IN EACH  NOSTRIL DAILY 48 g 0  . sertraline (ZOLOFT) 100 MG tablet TAKE 1 AND 1/2 TABLETS BY  MOUTH DAILY 135 tablet 3  . tamsulosin (FLOMAX) 0.4 MG CAPS capsule TAKE 1 CAPSULE BY MOUTH  TWICE DAILY 180 capsule 3   Current Facility-Administered Medications on File Prior to Visit  Medication Dose Route Frequency Provider Last Rate Last Admin  . 0.9 %  sodium chloride infusion  500 mL Intravenous Continuous Danis, Estill Cotta III, MD        BP (!) 146/92   Temp (!) 97.2 F (36.2 C)   Ht 5\' 8"  (1.727 m)   Wt 192 lb (87.1 kg)   BMI 29.19 kg/m       Objective:   Physical Exam Vitals and nursing note reviewed.  Constitutional:      General: He is not in acute  distress.    Appearance: Normal appearance. He is well-developed and overweight.  HENT:     Head: Normocephalic and atraumatic.     Right Ear: Tympanic membrane, ear canal and external ear normal. There is no impacted cerumen.     Left Ear: Tympanic membrane, ear canal  and external ear normal. There is no impacted cerumen.     Nose: Nose normal. No congestion or rhinorrhea.     Mouth/Throat:     Mouth: Mucous membranes are moist.     Pharynx: Oropharynx is clear. No oropharyngeal exudate or posterior oropharyngeal erythema.  Eyes:     General:        Right eye: No discharge.        Left eye: No discharge.     Extraocular Movements: Extraocular movements intact.     Conjunctiva/sclera: Conjunctivae normal.     Pupils: Pupils are equal, round, and reactive to light.  Neck:     Vascular: No carotid bruit.     Trachea: No tracheal deviation.  Cardiovascular:     Rate and Rhythm: Normal rate and regular rhythm.     Pulses: Normal pulses.     Heart sounds: Normal heart sounds. No murmur heard.  No friction rub. No gallop.   Pulmonary:     Effort: Pulmonary effort is normal. No respiratory distress.     Breath sounds: Normal breath sounds. No stridor. No wheezing, rhonchi or rales.  Chest:     Chest wall: No tenderness.  Abdominal:     General: Bowel sounds are normal. There is no distension.     Palpations: Abdomen is soft. There is no mass.     Tenderness: There is no abdominal tenderness. There is no right CVA tenderness, left CVA tenderness, guarding or rebound.     Hernia: No hernia is present.  Musculoskeletal:        General: No swelling, tenderness, deformity or signs of injury. Normal range of motion.     Right lower leg: No edema.     Left lower leg: No edema.  Lymphadenopathy:     Cervical: No cervical adenopathy.  Skin:    General: Skin is warm and dry.     Capillary Refill: Capillary refill takes less than 2 seconds.     Coloration: Skin is not jaundiced or pale.      Findings: No bruising, erythema, lesion or rash.  Neurological:     General: No focal deficit present.     Mental Status: He is alert and oriented to person, place, and time.     Cranial Nerves: No cranial nerve deficit.     Sensory: No sensory deficit.     Motor: No weakness.     Coordination: Coordination normal.     Gait: Gait normal.     Deep Tendon Reflexes: Reflexes normal.  Psychiatric:        Mood and Affect: Mood normal.        Behavior: Behavior normal.        Thought Content: Thought content normal.        Judgment: Judgment normal.       Assessment & Plan:  1. Elevated PSA - Encouraged to call and reschedule appointment with Urology  - CBC with Differential/Platelet; Future - Comprehensive metabolic panel; Future - Hemoglobin A1c; Future - Lipid panel; Future - TSH; Future  2. Hyperlipidemia, unspecified hyperlipidemia type - Consider increase in statin  - CBC with Differential/Platelet; Future - Comprehensive metabolic panel; Future - Hemoglobin A1c; Future - Lipid panel; Future - TSH; Future  3. BPH with urinary obstruction  - PSA; Future  4. Controlled type 2 diabetes mellitus without complication, without long-term current use of insulin ([place]) - Consider Metformin  - CBC with Differential/Platelet; Future - Comprehensive metabolic panel; Future - Hemoglobin  A1c; Future - Lipid panel; Future - TSH; Future  5. Vitamin D deficiency  - Vitamin D, 25-hydroxy; Future  6. Generalized anxiety disorder - Continue with Zoloft   7. Essential hypertension - Will start on low dose lisinopril. Advised to monitor at home and report readings back via mychart in the next two weeks  - lisinopril (ZESTRIL) 2.5 MG tablet; Take 1 tablet (2.5 mg total) by mouth daily.  Dispense: 90 tablet; Refill: 0   Jeffrey Peng, NP

## 2020-03-18 ENCOUNTER — Other Ambulatory Visit (INDEPENDENT_AMBULATORY_CARE_PROVIDER_SITE_OTHER): Payer: Medicare Other

## 2020-03-18 DIAGNOSIS — N401 Enlarged prostate with lower urinary tract symptoms: Secondary | ICD-10-CM

## 2020-03-18 DIAGNOSIS — E119 Type 2 diabetes mellitus without complications: Secondary | ICD-10-CM | POA: Diagnosis not present

## 2020-03-18 DIAGNOSIS — E785 Hyperlipidemia, unspecified: Secondary | ICD-10-CM | POA: Diagnosis not present

## 2020-03-18 DIAGNOSIS — E559 Vitamin D deficiency, unspecified: Secondary | ICD-10-CM

## 2020-03-18 DIAGNOSIS — N138 Other obstructive and reflux uropathy: Secondary | ICD-10-CM

## 2020-03-18 DIAGNOSIS — R972 Elevated prostate specific antigen [PSA]: Secondary | ICD-10-CM | POA: Diagnosis not present

## 2020-03-18 LAB — COMPREHENSIVE METABOLIC PANEL
ALT: 15 U/L (ref 0–53)
AST: 16 U/L (ref 0–37)
Albumin: 4 g/dL (ref 3.5–5.2)
Alkaline Phosphatase: 66 U/L (ref 39–117)
BUN: 14 mg/dL (ref 6–23)
CO2: 31 mEq/L (ref 19–32)
Calcium: 8.8 mg/dL (ref 8.4–10.5)
Chloride: 103 mEq/L (ref 96–112)
Creatinine, Ser: 1.12 mg/dL (ref 0.40–1.50)
GFR: 64.04 mL/min (ref >60.00)
Glucose, Bld: 126 mg/dL — ABNORMAL HIGH (ref 70–99)
Potassium: 4.2 mEq/L (ref 3.5–5.1)
Sodium: 140 mEq/L (ref 135–145)
Total Bilirubin: 0.4 mg/dL (ref 0.2–1.2)
Total Protein: 7.1 g/dL (ref 6.0–8.3)

## 2020-03-18 LAB — LIPID PANEL
Cholesterol: 148 mg/dL (ref 0–200)
HDL: 45.5 mg/dL (ref >39.00)
LDL Cholesterol: 85 mg/dL (ref 0–99)
NonHDL: 102.46
Total CHOL/HDL Ratio: 3
Triglycerides: 86 mg/dL (ref 0.0–149.0)
VLDL: 17.2 mg/dL (ref 0.0–40.0)

## 2020-03-18 LAB — CBC WITH DIFFERENTIAL/PLATELET
Basophils Absolute: 0 10*3/uL (ref 0.0–0.1)
Basophils Relative: 0.7 % (ref 0.0–3.0)
Eosinophils Absolute: 0.2 10*3/uL (ref 0.0–0.7)
Eosinophils Relative: 4.4 % (ref 0.0–5.0)
HCT: 40.1 % (ref 39.0–52.0)
Hemoglobin: 13.4 g/dL (ref 13.0–17.0)
Lymphocytes Relative: 25.5 % (ref 12.0–46.0)
Lymphs Abs: 1.2 10*3/uL (ref 0.7–4.0)
MCHC: 33.4 g/dL (ref 30.0–36.0)
MCV: 91.7 fl (ref 78.0–100.0)
Monocytes Absolute: 0.4 10*3/uL (ref 0.1–1.0)
Monocytes Relative: 8.2 % (ref 3.0–12.0)
Neutro Abs: 2.9 10*3/uL (ref 1.4–7.7)
Neutrophils Relative %: 61.2 % (ref 43.0–77.0)
Platelets: 228 10*3/uL (ref 150.0–400.0)
RBC: 4.37 Mil/uL (ref 4.22–5.81)
RDW: 13.3 % (ref 11.5–15.5)
WBC: 4.7 10*3/uL (ref 4.0–10.5)

## 2020-03-18 LAB — TSH: TSH: 1.69 u[IU]/mL (ref 0.35–4.50)

## 2020-03-18 LAB — VITAMIN D 25 HYDROXY (VIT D DEFICIENCY, FRACTURES): VITD: 36.83 ng/mL (ref 30.00–100.00)

## 2020-03-18 LAB — PSA: PSA: 12.65 ng/mL — ABNORMAL HIGH (ref 0.10–4.00)

## 2020-03-19 LAB — HEMOGLOBIN A1C: Hgb A1c MFr Bld: 6.7 % — ABNORMAL HIGH (ref 4.6–6.5)

## 2020-04-29 DIAGNOSIS — R972 Elevated prostate specific antigen [PSA]: Secondary | ICD-10-CM | POA: Diagnosis not present

## 2020-04-29 DIAGNOSIS — R3915 Urgency of urination: Secondary | ICD-10-CM | POA: Diagnosis not present

## 2020-04-29 DIAGNOSIS — N403 Nodular prostate with lower urinary tract symptoms: Secondary | ICD-10-CM | POA: Diagnosis not present

## 2020-05-03 ENCOUNTER — Other Ambulatory Visit: Payer: Self-pay | Admitting: Adult Health

## 2020-05-03 DIAGNOSIS — I10 Essential (primary) hypertension: Secondary | ICD-10-CM

## 2020-05-03 DIAGNOSIS — F411 Generalized anxiety disorder: Secondary | ICD-10-CM

## 2020-05-05 NOTE — Telephone Encounter (Signed)
SENT TO THE PHARMACY BY E-SCRIBE. 

## 2020-05-13 ENCOUNTER — Encounter: Payer: Self-pay | Admitting: Adult Health

## 2020-05-13 ENCOUNTER — Other Ambulatory Visit: Payer: Self-pay | Admitting: Adult Health

## 2020-05-13 MED ORDER — LISINOPRIL 10 MG PO TABS
10.0000 mg | ORAL_TABLET | Freq: Every day | ORAL | 0 refills | Status: DC
Start: 2020-05-13 — End: 2020-06-09

## 2020-05-25 ENCOUNTER — Other Ambulatory Visit: Payer: Self-pay | Admitting: Urology

## 2020-05-25 DIAGNOSIS — R972 Elevated prostate specific antigen [PSA]: Secondary | ICD-10-CM

## 2020-05-25 DIAGNOSIS — N402 Nodular prostate without lower urinary tract symptoms: Secondary | ICD-10-CM

## 2020-06-09 ENCOUNTER — Other Ambulatory Visit: Payer: Self-pay | Admitting: Adult Health

## 2020-06-09 MED ORDER — LISINOPRIL 10 MG PO TABS: 10.0000 mg | ORAL_TABLET | Freq: Every day | ORAL | 3 refills | Status: DC

## 2020-06-18 ENCOUNTER — Ambulatory Visit
Admission: RE | Admit: 2020-06-18 | Discharge: 2020-06-18 | Disposition: A | Discharge status: Home / Self Care | Payer: Medicare Other | Source: Ambulatory Visit | Attending: Urology | Admitting: Urology

## 2020-06-18 ENCOUNTER — Other Ambulatory Visit: Payer: Self-pay

## 2020-06-18 DIAGNOSIS — R590 Localized enlarged lymph nodes: Secondary | ICD-10-CM | POA: Diagnosis not present

## 2020-06-18 DIAGNOSIS — R972 Elevated prostate specific antigen [PSA]: Secondary | ICD-10-CM | POA: Diagnosis not present

## 2020-06-18 DIAGNOSIS — N400 Enlarged prostate without lower urinary tract symptoms: Secondary | ICD-10-CM | POA: Diagnosis not present

## 2020-06-18 DIAGNOSIS — N402 Nodular prostate without lower urinary tract symptoms: Secondary | ICD-10-CM

## 2020-06-18 IMAGING — MR MR PROSTATE WO/W CM
12 series · 48 of 48 positions shown · IV contrast (multihance)
Comparison: None.

CLINICAL DATA: Elevated PSA. PSA equal 12.7 in [DATE]. Negative
biopsy 15 years prior.

EXAM:
MR PROSTATE WITHOUT AND WITH CONTRAST
TECHNIQUE: Multiplanar multisequence MRI images were obtained of the pelvis
centered about the prostate. Pre and post contrast images were
obtained.
CONTRAST:  18mL MULTIHANCE GADOBENATE DIMEGLUMINE 529 MG/ML IV SOLN

[Series 3: T2 · coronal · 3.0mm · 0.56mm/px · 1 of 26 slices shown (1 of 3)]
[im 1/26]
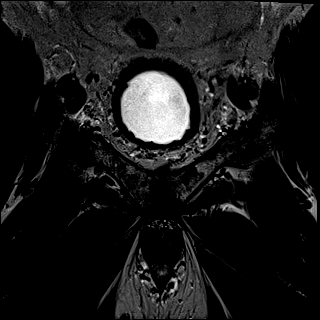

[Series 4: T1 · axial · 5.0mm · 1.25mm/px · 1 of 80 slices shown]
[im 1/80]
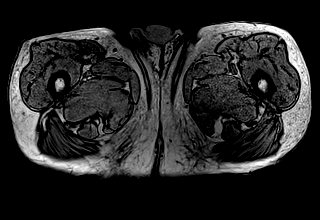

[Series 5: DWI · axial · 3.0mm · 1.75mm/px · 1 of 81 slices shown (1 of 3)]
[im 1/81]
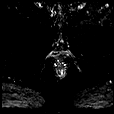

[Series 6: DWI · axial · 3.0mm · 1.75mm/px · 1 of 27 slices shown (2 of 3)]
[im 1/27]
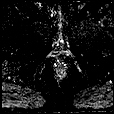

[Series 7: DWI · axial · 3.0mm · 1.75mm/px · 1 of 27 slices shown (3 of 3)]
[im 1/27]
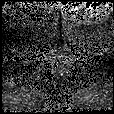

[Series 8: T2 · axial · 3.0mm · 0.56mm/px · 1 of 30 slices shown (2 of 3)]
[im 1/30]
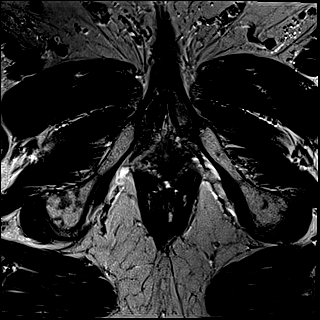

[Series 9: T2 · axial · 1.0mm · 1.04mm/px · z∈[-91,+12]mm · 2 of 104 slices shown (3 of 3)]
[im 1/104]
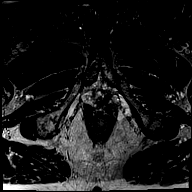
[im 104/104]
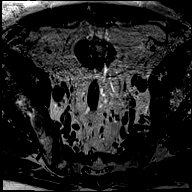

[Series 10: pre t1_twist_tra_dyn · axial · non-contrast · 3.5mm · 0.83mm/px · 1 of 26 slices shown]
[im 1/26]
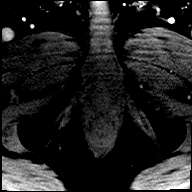

[Series 11: post t1_twist_tra_dyn-copy center · axial · non-contrast · 3.5mm · 0.83mm/px · z∈[-92,-4]mm · 18 of 780 slices shown]
[im 1/780]
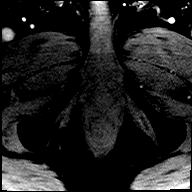
[im 46/780]
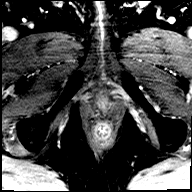
[im 92/780]
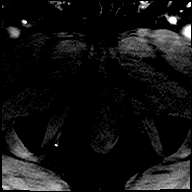
[im 138/780]
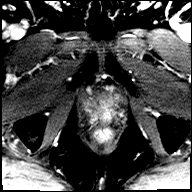
[im 184/780]
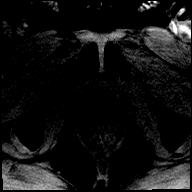
[im 230/780]
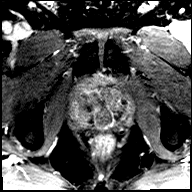
[im 275/780]
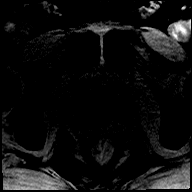
[im 321/780]
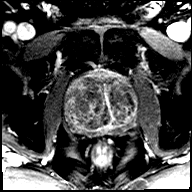
[im 367/780]
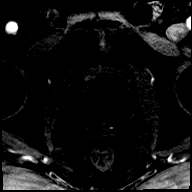
[im 413/780]
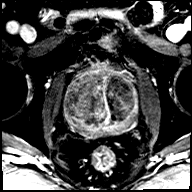
[im 459/780]
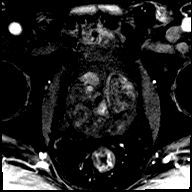
[im 505/780]
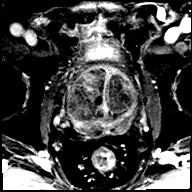
[im 550/780]
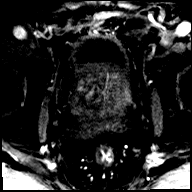
[im 596/780]
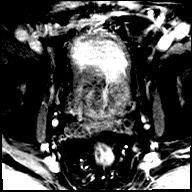
[im 642/780]
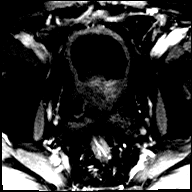
[im 688/780]
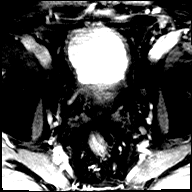
[im 734/780]
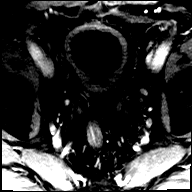
[im 780/780]
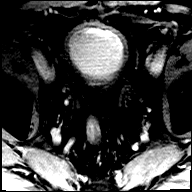

[Series 12: post t1_twist_tra_dyn-copy cent_sub · axial · 3.5mm · 0.83mm/px · z∈[-92,-4]mm · 17 of 750 slices shown]
[im 1/750]
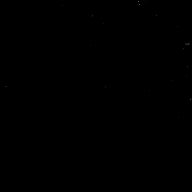
[im 47/750]
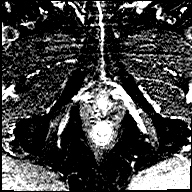
[im 94/750]
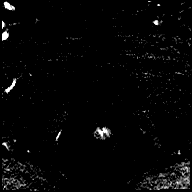
[im 141/750]
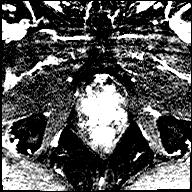
[im 188/750]
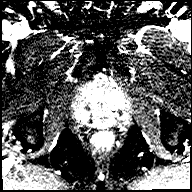
[im 235/750]
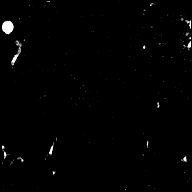
[im 281/750]
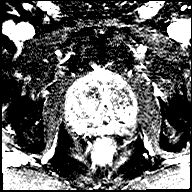
[im 328/750]
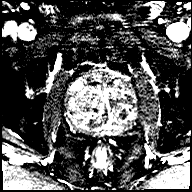
[im 375/750]
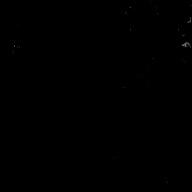
[im 422/750]
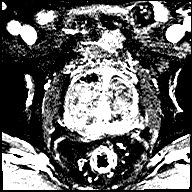
[im 469/750]
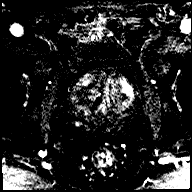
[im 515/750]
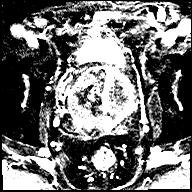
[im 562/750]
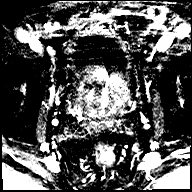
[im 609/750]
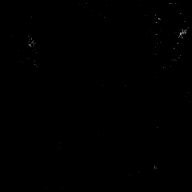
[im 656/750]
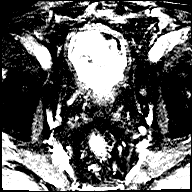
[im 703/750]
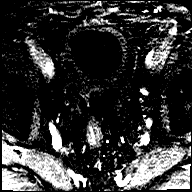
[im 750/750]
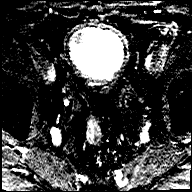

[Series 13: t1_vibe_dixon_tra_f · axial · 2.5mm · 0.91mm/px · z∈[-146,+51]mm · 2 of 80 slices shown]
[im 1/80]
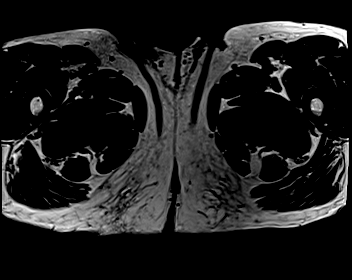
[im 80/80]
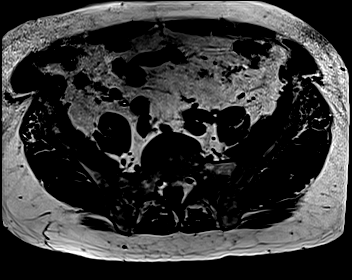

[Series 14: t1_vibe_dixon_tra_w · axial · 2.5mm · 0.91mm/px · z∈[-146,+51]mm · 2 of 80 slices shown]
[im 1/80]
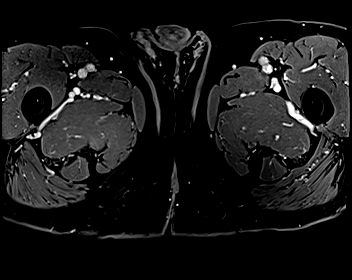
[im 80/80]
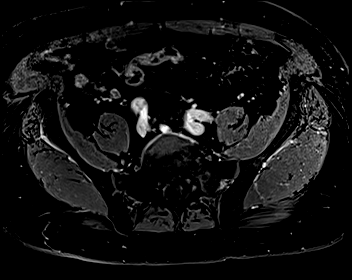

[48 of 48 positions shown; findings below may reference images not displayed]

FINDINGS: Prostate: Peripheral zone is thinned by the enlarged transitional
zone. No foci of restricted diffusion within the peripheral zone. No
abnormal signal intensity within the peripheral zone on T2 weighted
imaging (series 6 and series 8). No abnormal enhancement.

The transitional zone is enlarged by multiple capsulated nodules
which have no suspicious imaging characteristics on T2 weighted
imaging. No restricted diffusion.

Prostatic capsule is intact.  Seminal vesicles are normal.

Volume: 6.3 x 6.5 x 5.7 cm (volume = 120 cm^3)

Transcapsular spread:  Absent

Seminal vesicle involvement: Absent

Neurovascular bundle involvement: Absent

Pelvic adenopathy: 7 mm RIGHT external iliac lymph node (image
12/series 14). 6 mm LEFT external iliac lymph node at the same level
(image 11/series 14). LEFT operator node measures 5 mm (image
25/14).

Bone metastasis: Heterogeneous marrow signal.

Other findings: None
IMPRESSION: 1. No evidence of high-grade carcinoma in the peripheral zone (
PI-RADS: 1).
2. Enlarged nodular transitional zone most consistent with benign
prostate hypertrophy ( PI-RADS: 2).
3. Prostatomegaly.
4. Bilateral iliac lymph nodes measuring between 5 and 7 mm.
Indeterminate finding.

## 2020-06-18 MED ORDER — GADOBENATE DIMEGLUMINE 529 MG/ML IV SOLN
18.0000 mL | Freq: Once | INTRAVENOUS | Status: AC | PRN
  Administered 2020-06-18: 18 mL via INTRAVENOUS

## 2020-07-22 DIAGNOSIS — R972 Elevated prostate specific antigen [PSA]: Secondary | ICD-10-CM | POA: Diagnosis not present

## 2020-07-24 ENCOUNTER — Other Ambulatory Visit: Payer: Self-pay | Admitting: Adult Health

## 2020-09-03 DIAGNOSIS — Z23 Encounter for immunization: Secondary | ICD-10-CM | POA: Diagnosis not present

## 2020-09-09 ENCOUNTER — Other Ambulatory Visit: Payer: Self-pay | Admitting: Adult Health

## 2020-09-09 DIAGNOSIS — N138 Other obstructive and reflux uropathy: Secondary | ICD-10-CM

## 2020-09-24 ENCOUNTER — Encounter: Payer: Self-pay | Admitting: Adult Health

## 2020-09-24 ENCOUNTER — Ambulatory Visit (INDEPENDENT_AMBULATORY_CARE_PROVIDER_SITE_OTHER): Payer: Medicare Other | Admitting: Adult Health

## 2020-09-24 ENCOUNTER — Other Ambulatory Visit: Payer: Self-pay

## 2020-09-24 VITALS — BP 120/80 | HR 73 | Temp 98.0°F | Ht 68.0 in | Wt 190.8 lb

## 2020-09-24 DIAGNOSIS — I10 Essential (primary) hypertension: Secondary | ICD-10-CM

## 2020-09-24 DIAGNOSIS — E119 Type 2 diabetes mellitus without complications: Secondary | ICD-10-CM | POA: Diagnosis not present

## 2020-09-24 LAB — POCT GLYCOSYLATED HEMOGLOBIN (HGB A1C): Hemoglobin A1C: 6.3 % — AB (ref 4.0–5.6)

## 2020-09-24 NOTE — Progress Notes (Signed)
Subjective:    Patient ID: Jeffrey Spears, male    DOB: 07-Oct-1945, 74 y.o.   MRN: [id number]  HPI  74 year old male who  has a past medical history of Allergy, Anxiety, Colon polyp, Diabetes mellitus ([place]), Hyperlipidemia, Seasonal allergies, and Vitamin D deficiency.  He presents to the office today for 6 month follow up regarding DM and hypertension. He is currently diet controlled in regards to his diabetes. He tries to stay active and eat healthy   His BP is managed with Lisinopril 10 mg. He denies symptoms of hyper or hypotension   BP Readings from Last 3 Encounters:  09/24/20 120/80  03/17/20 (!) 146/92  10/25/18 130/84    Lab Results  Component Value Date   HGBA1C 6.7 (H) 03/18/2020   Review of Systems See HPI   Past Medical History:  Diagnosis Date  . Allergy   . Anxiety   . Colon polyp   . Diabetes mellitus ([place])   . Hyperlipidemia   . Seasonal allergies   . Vitamin D deficiency     Social History   Socioeconomic History  . Marital status: Married    Spouse name: Not on file  . Number of children: Not on file  . Years of education: Not on file  . Highest education level: Not on file  Occupational History  . Not on file  Tobacco Use  . Smoking status: Never Smoker  . Smokeless tobacco: Never Used  Vaping Use  . Vaping Use: Never used  Substance and Sexual Activity  . Alcohol use: Yes    Alcohol/week: 0.0 standard drinks    Comment: one drink nightly   . Drug use: No  . Sexual activity: Yes  Other Topics Concern  . 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    Social Determinants of Health   Financial Resource Strain: Not on file  Food Insecurity: Not on file  Transportation Needs: Not on file  Physical Activity: Not on file  Stress: Not on file  Social Connections: Not on file  Intimate Partner Violence: Not on file    Past  Surgical History:  Procedure Laterality Date  . CATARACT EXTRACTION  2014   bilateral   . CERVICAL SPINE SURGERY  2003   herniated disk   . COLONOSCOPY    . HERNIA REPAIR    . PARS PLANA VITRECTOMY W/ REPAIR OF MACULAR HOLE  2013  . POLYPECTOMY    . ROTATOR CUFF REPAIR  2006   left shoulder  . VASECTOMY     1982    Family History  Problem Relation Age of Onset  . Emphysema Mother   . Heart disease Father   . Stroke Brother   . Alzheimer's disease Brother   . Alzheimer's disease Maternal Grandmother   . Colon cancer Neg Hx   . Esophageal cancer Neg Hx   . Rectal cancer Neg Hx   . Stomach cancer Neg Hx     No Known Allergies  Current Outpatient Medications on File Prior to Visit  Medication Sig Dispense Refill  . atorvastatin (LIPITOR) 40 MG tablet TAKE 1 TABLET BY MOUTH  DAILY 90 tablet 3  . ergocalciferol (VITAMIN D2) 1.25 MG (50000 UT) capsule Take by mouth.    . finasteride (PROSCAR) 5 MG tablet Take by mouth.    Marland Kitchen  fluticasone (FLONASE) 50 MCG/ACT nasal spray Place 2 sprays into both nostrils daily. 48 g 3  . lisinopril (ZESTRIL) 10 MG tablet Take 1 tablet (10 mg total) by mouth daily. 90 tablet 3  . sertraline (ZOLOFT) 100 MG tablet TAKE 1 AND 1/2 TABLETS BY  MOUTH DAILY 135 tablet 3  . tamsulosin (FLOMAX) 0.4 MG CAPS capsule TAKE 1 CAPSULE BY MOUTH  TWICE DAILY 180 capsule 3   No current facility-administered medications on file prior to visit.    BP 120/80 (BP Location: Left Arm, Patient Position: Sitting, Cuff Size: Normal)   Pulse 73   Temp 98 F (36.7 C) (Oral)   Ht 5\' 8"  (1.727 m)   Wt 190 lb 12.8 oz (86.5 kg)   SpO2 95%   BMI 29.01 kg/m       Objective:   Physical Exam Vitals and nursing note reviewed.  Constitutional:      Appearance: Normal appearance.  Pulmonary:     Effort: Pulmonary effort is normal.     Breath sounds: Normal breath sounds.  Skin:    General: Skin is warm and dry.     Capillary Refill: Capillary refill takes less than 2  seconds.  Neurological:     General: No focal deficit present.     Mental Status: He is alert and oriented to person, place, and time.  Psychiatric:        Mood and Affect: Mood normal.        Behavior: Behavior normal.        Thought Content: Thought content normal.       Assessment & Plan:  1. Controlled type 2 diabetes mellitus without complication, without long-term current use of insulin ([facility])  - POC HgB A1c- 6.3 - Well controlled with diet - Follow up in 6 months at CPE   2. Essential hypertension - Well controlled with lisinopril 10 mg - Continue current dose   Dorothyann Peng, NP

## 2020-10-01 ENCOUNTER — Telehealth: Payer: Self-pay | Admitting: Adult Health

## 2020-10-01 NOTE — Telephone Encounter (Signed)
Left message for patient to call back and schedule Medicare Annual Wellness Visit (AWV) either virtually or in office.   Last AWV ;12/15/15  please schedule at anytime with [facility] Nurse Health Advisor 1 or 2   This should be a 45 minute visit.  

## 2020-12-16 ENCOUNTER — Ambulatory Visit (INDEPENDENT_AMBULATORY_CARE_PROVIDER_SITE_OTHER): Payer: Medicare Other | Admitting: Adult Health

## 2020-12-16 ENCOUNTER — Encounter: Payer: Self-pay | Admitting: Adult Health

## 2020-12-16 ENCOUNTER — Other Ambulatory Visit: Payer: Self-pay

## 2020-12-16 VITALS — BP 128/84 | HR 64 | Temp 98.0°F | Ht 68.0 in | Wt 188.0 lb

## 2020-12-16 DIAGNOSIS — E559 Vitamin D deficiency, unspecified: Secondary | ICD-10-CM

## 2020-12-16 DIAGNOSIS — E785 Hyperlipidemia, unspecified: Secondary | ICD-10-CM

## 2020-12-16 DIAGNOSIS — F411 Generalized anxiety disorder: Secondary | ICD-10-CM | POA: Diagnosis not present

## 2020-12-16 DIAGNOSIS — N401 Enlarged prostate with lower urinary tract symptoms: Secondary | ICD-10-CM

## 2020-12-16 DIAGNOSIS — R972 Elevated prostate specific antigen [PSA]: Secondary | ICD-10-CM | POA: Diagnosis not present

## 2020-12-16 DIAGNOSIS — E119 Type 2 diabetes mellitus without complications: Secondary | ICD-10-CM

## 2020-12-16 DIAGNOSIS — N138 Other obstructive and reflux uropathy: Secondary | ICD-10-CM | POA: Diagnosis not present

## 2020-12-16 DIAGNOSIS — I10 Essential (primary) hypertension: Secondary | ICD-10-CM

## 2020-12-16 LAB — LIPID PANEL
Cholesterol: 156 mg/dL (ref 0–200)
HDL: 54.2 mg/dL (ref >39.00)
LDL Cholesterol: 83 mg/dL (ref 0–99)
NonHDL: 102.25
Total CHOL/HDL Ratio: 3
Triglycerides: 97 mg/dL (ref 0.0–149.0)
VLDL: 19.4 mg/dL (ref 0.0–40.0)

## 2020-12-16 LAB — COMPREHENSIVE METABOLIC PANEL
ALT: 18 U/L (ref 0–53)
AST: 21 U/L (ref 0–37)
Albumin: 4 g/dL (ref 3.5–5.2)
Alkaline Phosphatase: 58 U/L (ref 39–117)
BUN: 17 mg/dL (ref 6–23)
CO2: 31 mEq/L (ref 19–32)
Calcium: 8.8 mg/dL (ref 8.4–10.5)
Chloride: 103 mEq/L (ref 96–112)
Creatinine, Ser: 1.16 mg/dL (ref 0.40–1.50)
GFR: 61.83 mL/min (ref >60.00)
Glucose, Bld: 123 mg/dL — ABNORMAL HIGH (ref 70–99)
Potassium: 4.4 mEq/L (ref 3.5–5.1)
Sodium: 140 mEq/L (ref 135–145)
Total Bilirubin: 0.5 mg/dL (ref 0.2–1.2)
Total Protein: 7 g/dL (ref 6.0–8.3)

## 2020-12-16 LAB — CBC WITH DIFFERENTIAL/PLATELET
Basophils Absolute: 0 10*3/uL (ref 0.0–0.1)
Basophils Relative: 0.7 % (ref 0.0–3.0)
Eosinophils Absolute: 0.2 10*3/uL (ref 0.0–0.7)
Eosinophils Relative: 5.4 % — ABNORMAL HIGH (ref 0.0–5.0)
HCT: 40 % (ref 39.0–52.0)
Hemoglobin: 13.4 g/dL (ref 13.0–17.0)
Lymphocytes Relative: 26.5 % (ref 12.0–46.0)
Lymphs Abs: 1.1 10*3/uL (ref 0.7–4.0)
MCHC: 33.5 g/dL (ref 30.0–36.0)
MCV: 90.6 fl (ref 78.0–100.0)
Monocytes Absolute: 0.4 10*3/uL (ref 0.1–1.0)
Monocytes Relative: 8.9 % (ref 3.0–12.0)
Neutro Abs: 2.5 10*3/uL (ref 1.4–7.7)
Neutrophils Relative %: 58.5 % (ref 43.0–77.0)
Platelets: 211 10*3/uL (ref 150.0–400.0)
RBC: 4.42 Mil/uL (ref 4.22–5.81)
RDW: 14.2 % (ref 11.5–15.5)
WBC: 4.3 10*3/uL (ref 4.0–10.5)

## 2020-12-16 LAB — TSH: TSH: 2.69 u[IU]/mL (ref 0.35–4.50)

## 2020-12-16 LAB — VITAMIN D 25 HYDROXY (VIT D DEFICIENCY, FRACTURES): VITD: 34.38 ng/mL (ref 30.00–100.00)

## 2020-12-16 LAB — PSA: PSA: 8.89 ng/mL — ABNORMAL HIGH (ref 0.10–4.00)

## 2020-12-16 LAB — HEMOGLOBIN A1C: Hgb A1c MFr Bld: 6.5 % (ref 4.6–6.5)

## 2020-12-16 MED ORDER — SERTRALINE HCL 100 MG PO TABS
200.0000 mg | ORAL_TABLET | Freq: Every day | ORAL | 1 refills | Status: DC
Start: 2020-12-16 — End: 2021-05-17

## 2020-12-16 NOTE — Addendum Note (Signed)
Addended by: Tessie Fass D on: 12/16/2020 09:08 AM   Modules accepted: Orders

## 2020-12-16 NOTE — Progress Notes (Signed)
Subjective:    Patient ID: Jeffrey Spears, male    DOB: 04-03-1946, 75 y.o.   MRN: [id number]  HPI Patient presents for yearly preventative medicine examination. He is a pleasant 75 year old male who  has a past medical history of Allergy, Anxiety, Colon polyp, Diabetes mellitus ([place]), Hyperlipidemia, Seasonal allergies, and Vitamin D deficiency.  DM -currently diet controlled.  He tries to stay active and eat healthy Lab Results  Component Value Date   HGBA1C 6.3 (A) 09/24/2020   Hypertension-  managed with lisinopril 10 mg.  He denies dizziness, lightheadedness, chest pain, or shortness of breath BP Readings from Last 3 Encounters:  12/16/20 128/84  09/24/20 120/80  03/17/20 (!) 146/92   Hyperlipidemia- managed with lipitor 40 mg daily. He denies myalgia or fatigue  Lab Results  Component Value Date   CHOL 148 03/18/2020   HDL 45.50 03/18/2020   LDLCALC 85 03/18/2020   TRIG 86.0 03/18/2020   CHOLHDL 3 03/18/2020   Anxiety -Takes Zoloft 150 mg. He does report feeling anxious and would like to increase to 200 mg to see if he can get better control.  BPH/Elevated PSA -has a longstanding history of elevated PSA numbers.  Had a prostate biopsy about 11 years ago [employer] it was negative.  He is now seen by urology on a routine basis.  Had a biopsy in October 2021 was negative.   Symptoms are managed with Proscar 5 mg and Flomax 0.4 mg  Vitamin D Deficiency - Takes 1000 units daily.   All immunizations and health maintenance protocols were reviewed with the patient and needed orders were placed.  Appropriate screening laboratory values were ordered for the patient including screening of hyperlipidemia, renal function and hepatic function. If indicated by BPH, a PSA was ordered.  Medication reconciliation,  past medical history, social history, problem list and allergies were reviewed in detail with the patient  Goals were established with regard to weight loss, exercise, and   diet in compliance with medications  Wt Readings from Last 3 Encounters:  12/16/20 188 lb (85.3 kg)  09/24/20 190 lb 12.8 oz (86.5 kg)  03/17/20 192 lb (87.1 kg)   He is up to date on routine colon cancer screening   Denies acute issues. Reports " I feel good"    Review of Systems  Constitutional: Negative.   HENT: Negative.   Eyes: Negative.   Respiratory: Negative.   Cardiovascular: Negative.   Gastrointestinal: Negative.   Endocrine: Negative.   Genitourinary: Negative.   Musculoskeletal: Negative.   Skin: Negative.   Allergic/Immunologic: Negative.   Neurological: Negative.   Hematological: Negative.   Psychiatric/Behavioral: Negative.   All other systems reviewed and are negative.  Past Medical History:  Diagnosis Date  . Allergy   . Anxiety   . Colon polyp   . Diabetes mellitus ([place])   . Hyperlipidemia   . Seasonal allergies   . Vitamin D deficiency     Social History   Socioeconomic History  . Marital status: Married    Spouse name: Not on file  . Number of children: Not on file  . Years of education: Not on file  . Highest education level: Not on file  Occupational History  . Not on file  Tobacco Use  . Smoking status: Never Smoker  . Smokeless tobacco: Never Used  Vaping Use  . Vaping Use: Never used  Substance and Sexual Activity  . Alcohol use: Yes    Alcohol/week: 0.0  standard drinks    Comment: one drink nightly   . Drug use: No  . Sexual activity: Yes  Other Topics Concern  . Not on file  Social History Narrative   Retired from [employer] in [place]    Married    [place] to [place] in May to be closer to family.    Social Determinants of Health   Financial Resource Strain: Not on file  Food Insecurity: Not on file  Transportation Needs: Not on file  Physical Activity: Not on file  Stress: Not on file  Social Connections: Not on file  Intimate Partner Violence: Not on file    Past Surgical History:  Procedure  Laterality Date  . CATARACT EXTRACTION  2014   bilateral   . CERVICAL SPINE SURGERY  2003   herniated disk   . COLONOSCOPY    . HERNIA REPAIR    . PARS PLANA VITRECTOMY W/ REPAIR OF MACULAR HOLE  2013  . POLYPECTOMY    . ROTATOR CUFF REPAIR  2006   left shoulder  . VASECTOMY     1982    Family History  Problem Relation Age of Onset  . Emphysema Mother   . Heart disease Father   . Stroke Brother   . Alzheimer's disease Brother   . Alzheimer's disease Maternal Grandmother   . Colon cancer Neg Hx   . Esophageal cancer Neg Hx   . Rectal cancer Neg Hx   . Stomach cancer Neg Hx     No Known Allergies  Current Outpatient Medications on File Prior to Visit  Medication Sig Dispense Refill  . atorvastatin (LIPITOR) 40 MG tablet TAKE 1 TABLET BY MOUTH  DAILY 90 tablet 3  . finasteride (PROSCAR) 5 MG tablet Take by mouth.    . fluticasone (FLONASE) 50 MCG/ACT nasal spray Place 2 sprays into both nostrils daily. 48 g 3  . tamsulosin (FLOMAX) 0.4 MG CAPS capsule TAKE 1 CAPSULE BY MOUTH  TWICE DAILY 180 capsule 3  . lisinopril (ZESTRIL) 10 MG tablet Take 1 tablet (10 mg total) by mouth daily. (Patient not taking: Reported on 12/16/2020) 90 tablet 3   No current facility-administered medications on file prior to visit.    BP 128/84 (BP Location: Left Arm, Patient Position: Sitting, Cuff Size: Normal)   Pulse 64   Temp 98 F (36.7 C) (Oral)   Ht 5\' 8"  (1.727 m)   Wt 188 lb (85.3 kg)   SpO2 97%   BMI 28.59 kg/m       Objective:   Physical Exam Vitals and nursing note reviewed.  Constitutional:      General: He is not in acute distress.    Appearance: Normal appearance. He is well-developed and normal weight.  HENT:     Head: Normocephalic and atraumatic.     Right Ear: Tympanic membrane, ear canal and external ear normal. There is no impacted cerumen.     Left Ear: Tympanic membrane, ear canal and external ear normal. There is no impacted cerumen.     Nose: Nose normal. No  congestion or rhinorrhea.     Mouth/Throat:     Mouth: Mucous membranes are moist.     Pharynx: Oropharynx is clear. No oropharyngeal exudate or posterior oropharyngeal erythema.  Eyes:     General:        Right eye: No discharge.        Left eye: No discharge.     Extraocular Movements: Extraocular movements intact.  Conjunctiva/sclera: Conjunctivae normal.     Pupils: Pupils are equal, round, and reactive to light.  Neck:     Vascular: No carotid bruit.     Trachea: No tracheal deviation.  Cardiovascular:     Rate and Rhythm: Normal rate and regular rhythm.     Pulses: Normal pulses.     Heart sounds: Normal heart sounds. No murmur heard. No friction rub. No gallop.   Pulmonary:     Effort: Pulmonary effort is normal. No respiratory distress.     Breath sounds: Normal breath sounds. No stridor. No wheezing, rhonchi or rales.  Chest:     Chest wall: No tenderness.  Abdominal:     General: Bowel sounds are normal. There is no distension.     Palpations: Abdomen is soft. There is no mass.     Tenderness: There is no abdominal tenderness. There is no right CVA tenderness, left CVA tenderness, guarding or rebound.     Hernia: No hernia is present.  Musculoskeletal:        General: No swelling, tenderness, deformity or signs of injury. Normal range of motion.     Right lower leg: No edema.     Left lower leg: No edema.  Lymphadenopathy:     Cervical: No cervical adenopathy.  Skin:    General: Skin is warm and dry.     Capillary Refill: Capillary refill takes less than 2 seconds.     Coloration: Skin is not jaundiced or pale.     Findings: No bruising, erythema, lesion or rash.  Neurological:     General: No focal deficit present.     Mental Status: He is alert and oriented to person, place, and time.     Cranial Nerves: No cranial nerve deficit.     Sensory: No sensory deficit.     Motor: No weakness.     Coordination: Coordination normal.     Gait: Gait normal.      Deep Tendon Reflexes: Reflexes normal.  Psychiatric:        Mood and Affect: Mood normal.        Behavior: Behavior normal.        Thought Content: Thought content normal.        Judgment: Judgment normal.       Assessment & Plan:  1. Controlled type 2 diabetes mellitus without complication, without long-term current use of insulin ([place]) - Consider Metformin  - CBC with Differential/Platelet; Future - Comprehensive metabolic panel; Future - Hemoglobin A1c; Future - Lipid panel; Future - TSH; Future  2. Essential hypertension - Well controlled. No change in medications  - CBC with Differential/Platelet; Future - Comprehensive metabolic panel; Future - Hemoglobin A1c; Future - Lipid panel; Future - TSH; Future  3. Elevated PSA  - PSA; Future  4. Hyperlipidemia, unspecified hyperlipidemia type - Consider increase in statin  - CBC with Differential/Platelet; Future - Comprehensive metabolic panel; Future - Hemoglobin A1c; Future - Lipid panel; Future - TSH; Future  5. BPH with urinary obstruction  - PSA; Future  6. Generalized anxiety disorder - Will increase to 200 mg. Advised follow up if anxiety is not controled. - sertraline (ZOLOFT) 100 MG tablet; Take 2 tablets (200 mg total) by mouth daily.  Dispense: 180 tablet; Refill: 1  7. Vitamin D deficiency  - VITAMIN D 25 Hydroxy (Vit-D Deficiency, Fractures); Future  Dorothyann Peng, NP

## 2020-12-16 NOTE — Patient Instructions (Signed)
It was great seeing you today   I am going to increase your Zoloft to 200 mg daily. Please let me know if symptoms are not controlled.   We will follow up with you regarding your blood work   Please let me know if you need anything

## 2021-02-17 ENCOUNTER — Telehealth: Payer: Self-pay | Admitting: Adult Health

## 2021-02-17 NOTE — Telephone Encounter (Signed)
Tried calling patient to  schedule Medicare Annual Wellness Visit (AWV) either virtually or in office.  No answer   Last AWV 12/15/15  please schedule at anytime with [facility] Nurse Health Advisor 1 or 2   This should be a 45 minute visit.

## 2021-03-22 ENCOUNTER — Telehealth: Payer: Self-pay | Admitting: Adult Health

## 2021-03-22 NOTE — Telephone Encounter (Signed)
Left message for patient to call back and schedule Medicare Annual Wellness Visit (AWV) either virtually or in office.   Last AWV 12/15/15 please schedule at anytime with [facility] Nurse Health Advisor 1 or 2   This should be a 45 minute visit.

## 2021-03-23 NOTE — Telephone Encounter (Signed)
Patient is not interested in having his AWV

## 2021-03-29 ENCOUNTER — Other Ambulatory Visit: Payer: Self-pay

## 2021-03-30 ENCOUNTER — Ambulatory Visit (INDEPENDENT_AMBULATORY_CARE_PROVIDER_SITE_OTHER): Payer: Medicare Other | Admitting: Family Medicine

## 2021-03-30 ENCOUNTER — Encounter: Payer: Self-pay | Admitting: Family Medicine

## 2021-03-30 VITALS — BP 130/90 | HR 73 | Temp 97.7°F | Resp 16 | Ht 68.0 in | Wt 189.0 lb

## 2021-03-30 DIAGNOSIS — M5441 Lumbago with sciatica, right side: Secondary | ICD-10-CM | POA: Diagnosis not present

## 2021-03-30 DIAGNOSIS — E119 Type 2 diabetes mellitus without complications: Secondary | ICD-10-CM

## 2021-03-30 DIAGNOSIS — I10 Essential (primary) hypertension: Secondary | ICD-10-CM

## 2021-03-30 MED ORDER — PREDNISONE 20 MG PO TABS: ORAL_TABLET | ORAL | 0 refills | Status: DC

## 2021-03-30 MED ORDER — CELECOXIB 100 MG PO CAPS: 100.0000 mg | ORAL_CAPSULE | Freq: Two times a day (BID) | ORAL | 0 refills | Status: AC

## 2021-03-30 MED ORDER — PREDNISONE 20 MG PO TABS
ORAL_TABLET | ORAL | 0 refills | Status: AC
Start: 2021-03-30 — End: 2021-04-07

## 2021-03-30 NOTE — Progress Notes (Signed)
ACUTE VISIT Chief Complaint  Patient presents with   Hip Pain    Right hip, goes down to his leg & lower back pain. X a few weeks   HPI: Jeffrey Spears is a 75 y.o. male with history of DM 2, hyperlipidemia, and vitamin D deficiency here today complaining of a couple weeks of right hip pain as described above. He has had similar pain in the past but lasted a few days, milder and self-limited.  Pain is constant and it can be severe at times. Exacerbated by certain activities like standing up, alleviated by bending forward.  Problem is getting worse. Right lower back pain radiated to posterior thigh and lateral right knee. Negative for lower extremity numbness, burning, tingling, saddle anesthesia, or bladder/bowel dysfunction. No history of trauma. He has not noted fever, chills, change in appetite, abdominal pain, changes in bowel habits, urinary symptoms, or skin rash.  He took his wife's Gabapentin x 2, did not helped.  No hx of CKD or heart disease. He takes lisinopril 10 mg daily for hypertension.  Lab Results  Component Value Date   CREATININE 1.16 12/16/2020   BUN 17 12/16/2020   NA 140 12/16/2020   K 4.4 12/16/2020   CL 103 12/16/2020   CO2 31 12/16/2020   DM II: BS are "fine."  Lab Results  Component Value Date   HGBA1C 6.5 12/16/2020   Review of Systems  Constitutional:  Positive for activity change. Negative for unexpected weight change.  Respiratory:  Negative for cough, shortness of breath and wheezing.   Genitourinary:  Negative for decreased urine volume, dysuria and hematuria.  Neurological:  Negative for syncope and weakness.  Rest see pertinent positives and negatives per HPI.  Current Outpatient Medications on File Prior to Visit  Medication Sig Dispense Refill   atorvastatin (LIPITOR) 40 MG tablet TAKE 1 TABLET BY MOUTH  DAILY 90 tablet 3   finasteride (PROSCAR) 5 MG tablet Take by mouth.     fluticasone (FLONASE) 50 MCG/ACT nasal spray  Place 2 sprays into both nostrils daily. 48 g 3   lisinopril (ZESTRIL) 10 MG tablet Take 1 tablet (10 mg total) by mouth daily. 90 tablet 3   tamsulosin (FLOMAX) 0.4 MG CAPS capsule TAKE 1 CAPSULE BY MOUTH  TWICE DAILY 180 capsule 3   sertraline (ZOLOFT) 100 MG tablet Take 2 tablets (200 mg total) by mouth daily. 180 tablet 1   No current facility-administered medications on file prior to visit.   Past Medical History:  Diagnosis Date   Allergy    Anxiety    Colon polyp    Diabetes mellitus ([facility])    Hyperlipidemia    Seasonal allergies    Vitamin D deficiency    No Known Allergies  Social History   Socioeconomic History   Marital status: Married    Spouse name: Not on file   Number of children: Not on file   Years of education: Not on file   Highest education level: Not on file  Occupational History   Not on file  Tobacco Use   Smoking status: Never   Smokeless tobacco: Never  Vaping Use   Vaping Use: Never used  Substance and Sexual Activity   Alcohol use: Yes    Alcohol/week: 0.0 standard drinks    Comment: one drink nightly    Drug use: No   Sexual activity: Yes  Other Topics Concern   Not on file  Social History Narrative   Retired  from ship designing in [place]    Married    Moved to [place] in May to be closer to family.    Social Determinants of Health   Financial Resource Strain: Not on file  Food Insecurity: Not on file  Transportation Needs: Not on file  Physical Activity: Not on file  Stress: Not on file  Social Connections: Not on file   Vitals:   03/30/21 0916  BP: 130/90  Pulse: 73  Resp: 16  Temp: 97.7 F (36.5 C)  SpO2: 95%   Body mass index is 28.74 kg/m.  Physical Exam Vitals and nursing note reviewed.  Constitutional:      General: He is not in acute distress.    Appearance: He is well-developed.  HENT:     Head: Normocephalic and atraumatic.  Eyes:     Conjunctiva/sclera: Conjunctivae normal.  Cardiovascular:      Rate and Rhythm: Normal rate and regular rhythm.     Pulses:          Posterior tibial pulses are 2+ on the right side and 2+ on the left side.  Pulmonary:     Effort: Pulmonary effort is normal. No respiratory distress.     Breath sounds: Normal breath sounds.  Abdominal:     Palpations: Abdomen is soft.     Tenderness: There is no abdominal tenderness.  Musculoskeletal:     Lumbar back: No spasms or tenderness. Positive right straight leg raise test.     Right hip: No tenderness.     Comments: Antalgic gait.  Lymphadenopathy:     Cervical: No cervical adenopathy.  Skin:    General: Skin is warm.     Findings: No erythema or rash.  Neurological:     Mental Status: He is alert and oriented to person, place, and time.     Comments: Patellar DTR's 1-2+, symmetric   Psychiatric:     Comments: Well groomed, good eye contact.   ASSESSMENT AND PLAN:  Jeffrey Spears was seen today for hip pain.  Diagnoses and all orders for this visit:  Acute right-sided low back pain with right-sided sciatica I do not think imaging is needed at this time. We discussed some side effects of medications, recommend taking prednisone with breakfast and to wait 2 to 3 hours before taking the Celebrex. Instructed about warning signs. Recommend following with PCP in 3 to 4 weeks if pain has not resolved, in which case lumbar MRI needs to be considered.  -     predniSONE (DELTASONE) 20 MG tablet; 2 tabs for 5 days, 1 tabs for 4 days, 1/2 tabs for 3 days. Take tables together with breakfast. -     celecoxib (CELEBREX) 100 MG capsule; Take 1 capsule (100 mg total) by mouth 2 (two) times daily for 7 days.  Controlled type 2 diabetes mellitus without complication, without long-term current use of insulin ([place]) Problem has been well controlled. Instructed to monitor BS's more frequent up to 1-2 weeks after completing Predniosne treatment.  Essential hypertension DBP slightly elevated. We discussed some side  effects of NSAID's. Recommend monitoring BP closely.  Return in about 4 weeks (around 04/27/2021), or if symptoms worsen or fail to improve.   Brant Peets G. [place], MD  [facility]. [place] office.   A few things to remember from today's visit:   Acute right-sided low back pain with right-sided sciatica - Plan: predniSONE (DELTASONE) 20 MG tablet, celecoxib (CELEBREX) 100 MG capsule  Controlled type 2 diabetes mellitus  without complication, without long-term current use of insulin ([place])  Essential hypertension  If you need refills please call your pharmacy. Do not use My Chart to request refills or for acute issues that need immediate attention.   Take prednisone with breakfast and do not take with celebrex at the same time. Celebrex for 7 days. You can also take Tylenol with Celebrex. Monitor blood sugars and blood pressure. Follow with Tommi Rumps if pain has not resolved in 3-4 weeks, before if worse.  Please be sure medication list is accurate. If a new problem present, please set up appointment sooner than planned today.

## 2021-03-30 NOTE — Patient Instructions (Addendum)
A few things to remember from today's visit:   Acute right-sided low back pain with right-sided sciatica - Plan: predniSONE (DELTASONE) 20 MG tablet, celecoxib (CELEBREX) 100 MG capsule  Controlled type 2 diabetes mellitus without complication, without long-term current use of insulin ([place])  Essential hypertension  If you need refills please call your pharmacy. Do not use My Chart to request refills or for acute issues that need immediate attention.   Take prednisone with breakfast and do not take with celebrex at the same time. Celebrex for 7 days. You can also take Tylenol with Celebrex. Monitor blood sugars and blood pressure. Follow with Tommi Rumps if pain has not resolved in 3-4 weeks, before if worse.  Please be sure medication list is accurate. If a new problem present, please set up appointment sooner than planned today.

## 2021-03-30 NOTE — Addendum Note (Signed)
Addended by: Rodrigo Ran on: 03/30/2021 10:11 AM   Modules accepted: Orders

## 2021-04-12 ENCOUNTER — Encounter: Payer: Self-pay | Admitting: Adult Health

## 2021-04-13 ENCOUNTER — Other Ambulatory Visit: Payer: Self-pay | Admitting: Adult Health

## 2021-04-13 DIAGNOSIS — M5441 Lumbago with sciatica, right side: Secondary | ICD-10-CM

## 2021-04-15 ENCOUNTER — Other Ambulatory Visit: Payer: Self-pay

## 2021-04-15 ENCOUNTER — Ambulatory Visit
Admission: RE | Admit: 2021-04-15 | Discharge: 2021-04-15 | Disposition: A | Discharge status: Home / Self Care | Payer: Medicare Other | Source: Ambulatory Visit | Attending: Adult Health | Admitting: Adult Health

## 2021-04-15 DIAGNOSIS — M545 Low back pain, unspecified: Secondary | ICD-10-CM | POA: Diagnosis not present

## 2021-04-15 DIAGNOSIS — M48061 Spinal stenosis, lumbar region without neurogenic claudication: Secondary | ICD-10-CM | POA: Diagnosis not present

## 2021-04-15 DIAGNOSIS — M5441 Lumbago with sciatica, right side: Secondary | ICD-10-CM

## 2021-04-15 IMAGING — MR MR LUMBAR SPINE W/O CM
4 of 5 series · 26 of 48 positions shown · non-contrast
Comparison: None.

CLINICAL DATA: 75-year-old male with progressive low back pain.
Pain and numbness in the right knee for 1 month.

EXAM:
MRI LUMBAR SPINE WITHOUT CONTRAST
TECHNIQUE: Multiplanar, multisequence MR imaging of the lumbar spine was
performed. No intravenous contrast was administered.

[Series 2: T2 · sagittal · 4.0mm · 1.09mm/px · 6 of 17 slices shown (1 of 2)]
[im 1/17]
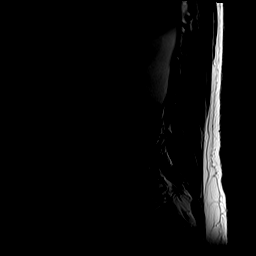
[im 4/17]
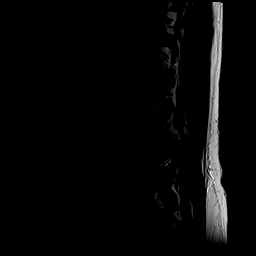
[im 7/17]
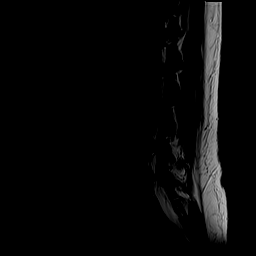
[im 10/17]
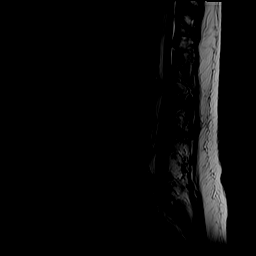
[im 13/17]
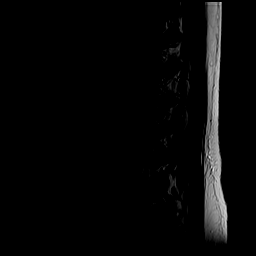
[im 17/17]
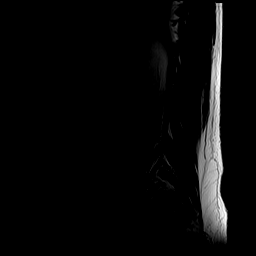

[Series 4: T1 · sagittal · 4.0mm · 1.09mm/px · 6 of 17 slices shown (1 of 2)]
[im 1/17]
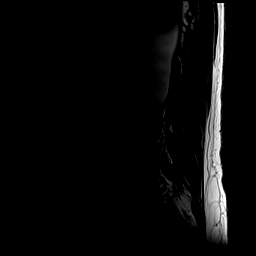
[im 4/17]
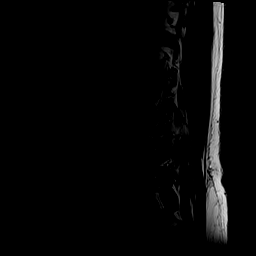
[im 7/17]
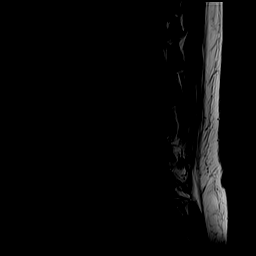
[im 10/17]
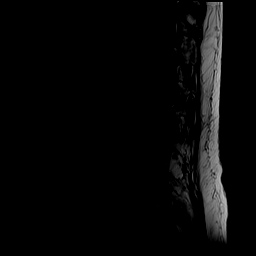
[im 13/17]
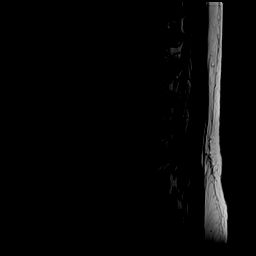
[im 17/17]
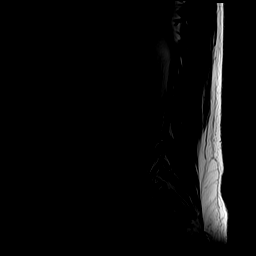

[Series 5: T2 · axial · 4.0mm · 0.39mm/px · z∈[-123,+93]mm · 9 of 42 slices shown (2 of 2)]
[im 1/42]
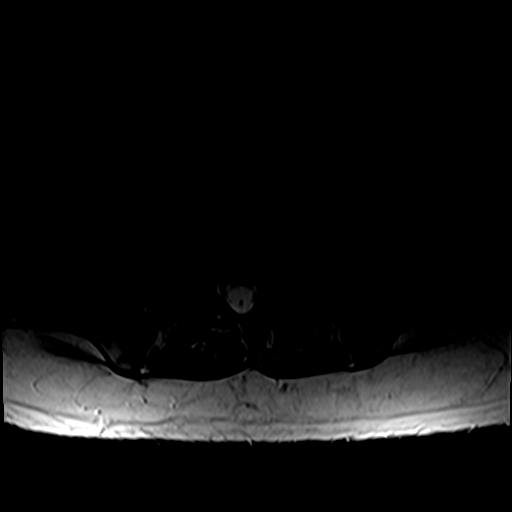
[im 6/42]
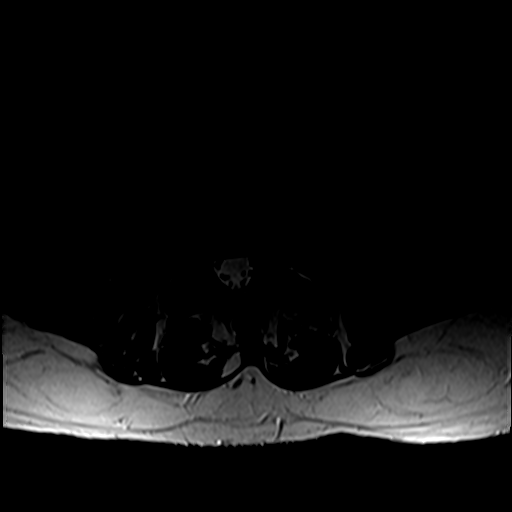
[im 12/42]
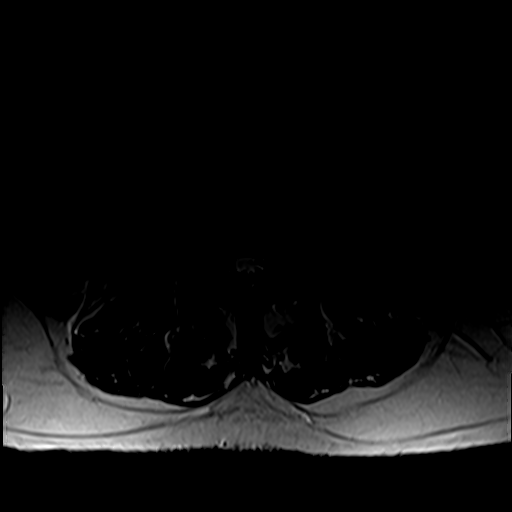
[im 18/42]
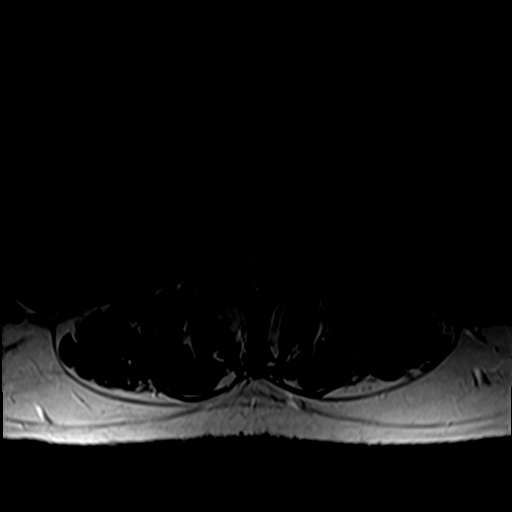
[im 21/42]
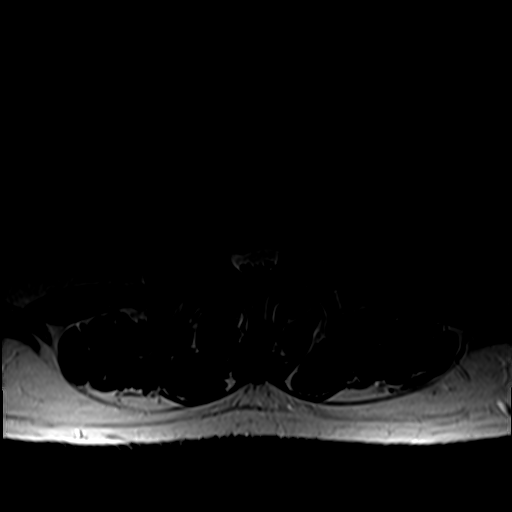
[im 24/42]
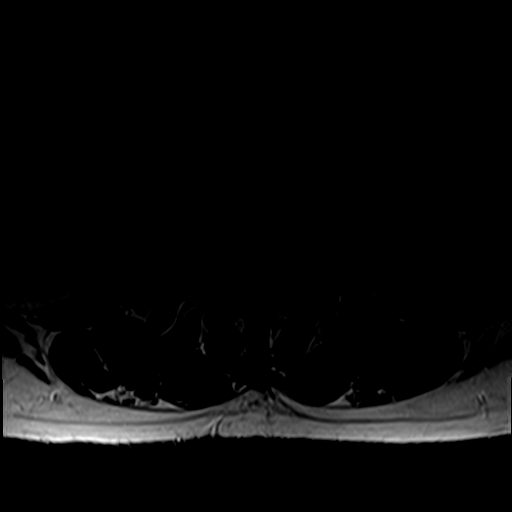
[im 30/42]
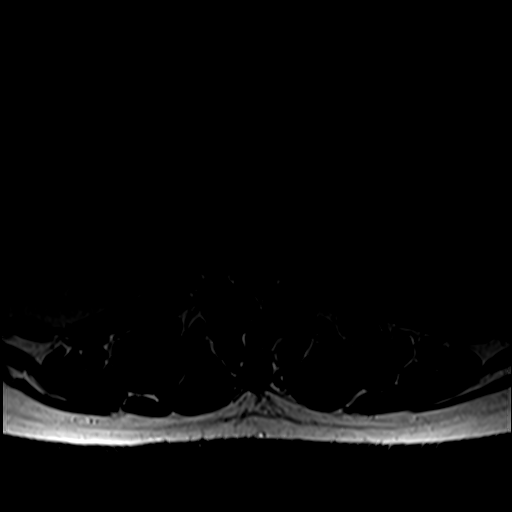
[im 36/42]
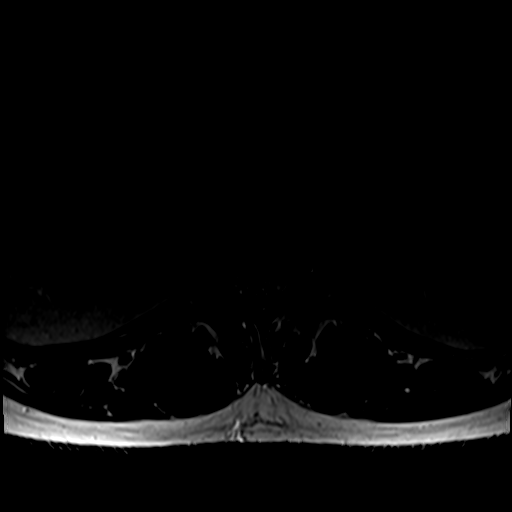
[im 42/42]
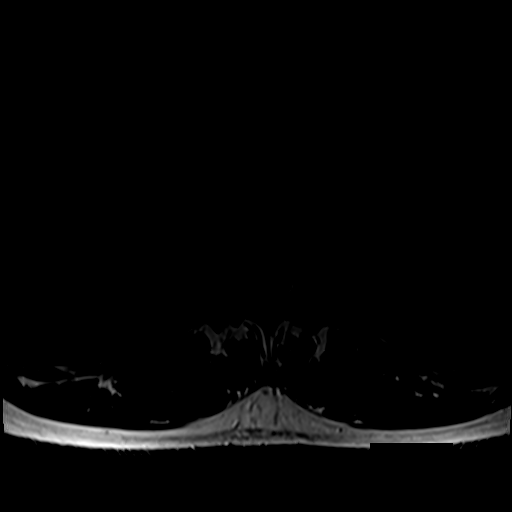

[Series 6: T1 · axial · 4.0mm · 0.39mm/px · z∈[-123,+64]mm · 5 of 42 slices shown (2 of 2)]
[im 1/42]
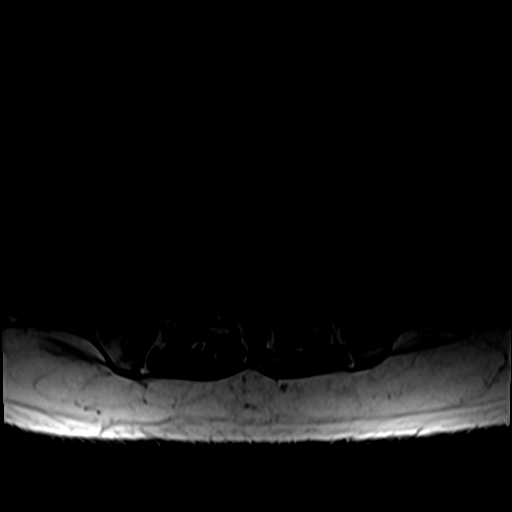
[im 6/42]
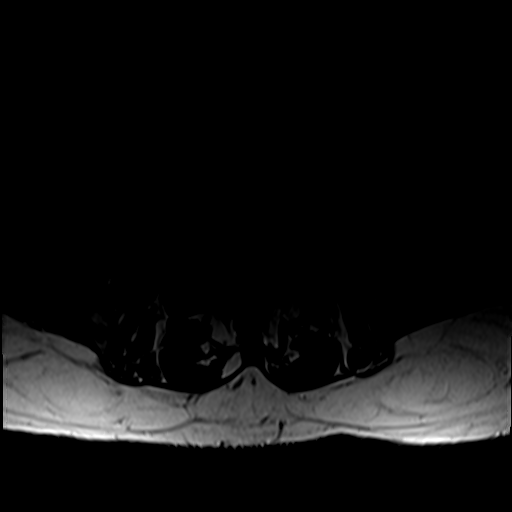
[im 12/42]
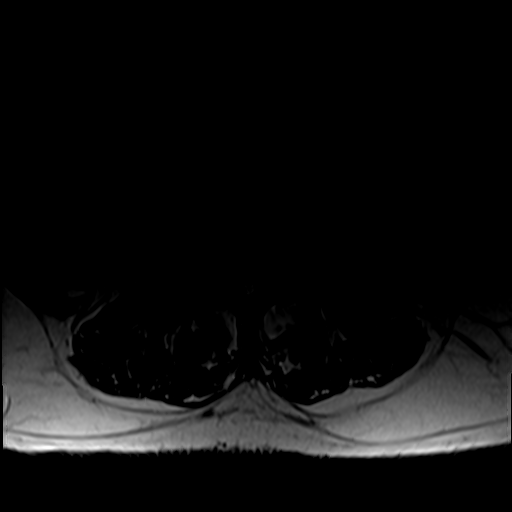
[im 21/42]
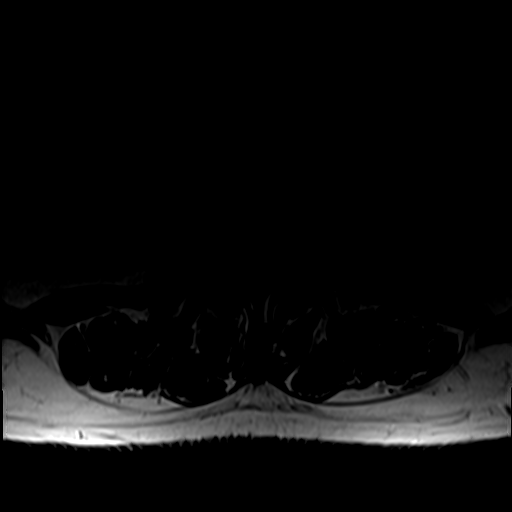
[im 36/42]
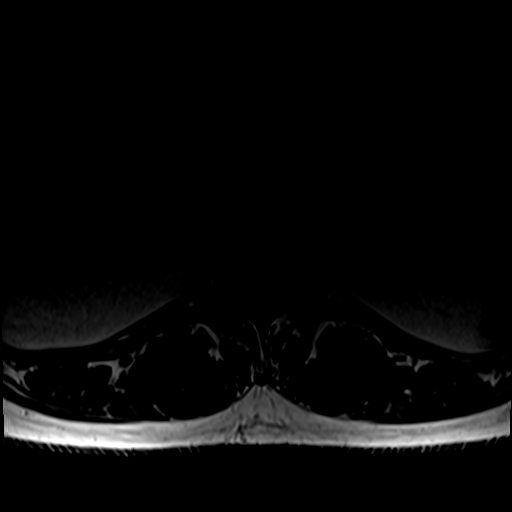

[26 of 48 positions shown; findings below may reference images not displayed]

FINDINGS: Segmentation: Lumbar segmentation appears to be normal and will be
designated as such for this report.

Alignment: Straightening of lumbar lordosis. Mild dextroconvex
lumbar scoliosis. No spondylolisthesis.

Vertebrae: Degenerative marrow edema in the left side posterior
elements of L5-S1 (series 3, image 13). See additional details of
that level below. Background bone marrow signal is mildly
heterogeneous but within normal limits. No suspicious marrow lesion.
Visible sacrum and SI joints appear intact.

Conus medullaris and cauda equina: Conus extends to the T12-L1
level. No lower spinal cord or conus signal abnormality.

Paraspinal and other soft tissues: Partially visible benign
appearing renal cysts. Other visualized abdominal viscera and
paraspinal soft tissues are within normal limits.

Disc levels:

Negative visible lower thoracic levels through T12-L1.

L1-L2: Left subarticular disc extrusion with a roughly 8 mm disc
fragment (series 2, image 11). But only mild left lateral recess and
left foraminal involvement. Mild facet hypertrophy. No spinal
stenosis.

L2-L3:  Circumferential disc bulge.  No significant stenosis.

L3-L4: Disc or disc osteophyte complex involving the right lateral
recess with moderate to severe stenosis (descending right L4 nerve
level series 5, image 25). Underlying mild disc bulging and facet
hypertrophy. Mild overall spinal stenosis. And mild to moderate
right L3 foraminal stenosis.

L4-L5: Rightward circumferential disc bulge with superimposed
subarticular and foraminal disc extrusion (series 5, image 30). Mild
facet and ligament flavum hypertrophy. Moderate right lateral recess
stenosis (right L5 nerve level) and severe right foraminal stenosis
(right L4 nerve level). No significant spinal stenosis.

L5-S1: Moderate facet hypertrophy greater on the left. Degenerative
facet joint fluid and marrow edema. Small posteriorly situated
synovial cysts which should not cause neural compromise (series 3,
image 12) but there is also a small 5-6 mm synovial cyst projecting
into the left L5 neural foramen with moderate to severe stenosis
when combined with mild disc bulging and endplate spurring (series
2, image 12). No spinal or lateral recess stenosis.
IMPRESSION: 1. Symptomatic level with regard to right side pain favored to be
L4-L5 where a foraminal disc extrusion affects the exiting right L4
and to a lesser extent descending right L5 nerve levels.
2. Moderate to severe right lateral recess stenosis also at L3-L4,
right L4 nerve level.
3. Moderate to severe left L5 foraminal stenosis at L5-S1 in part
due to a small facet related synovial cyst.
4. Small left subarticular disc extrusion at L1-L2 without
convincing neural impingement.

## 2021-04-19 ENCOUNTER — Encounter: Payer: Self-pay | Admitting: Family Medicine

## 2021-04-19 ENCOUNTER — Other Ambulatory Visit: Payer: Self-pay

## 2021-04-19 ENCOUNTER — Ambulatory Visit (INDEPENDENT_AMBULATORY_CARE_PROVIDER_SITE_OTHER): Payer: Medicare Other | Admitting: Family Medicine

## 2021-04-19 DIAGNOSIS — M5441 Lumbago with sciatica, right side: Secondary | ICD-10-CM

## 2021-04-19 MED ORDER — GABAPENTIN 300 MG PO CAPS: 300.0000 mg | ORAL_CAPSULE | Freq: Every evening | ORAL | 3 refills | Status: DC | PRN

## 2021-04-19 NOTE — Progress Notes (Signed)
Office Visit Note   Patient: Jeffrey Spears           Date of Birth: [birth year]           MRN: [id number] Visit Date: 04/19/2021 Requested by: Dorothyann Peng, NP [place],  [place] [address] PCP: Dorothyann Peng, NP  Subjective: Chief Complaint  Patient presents with   Lower Back - Pain    Pain in the right lower back/buttock x 3 weeks, with burning pain and numbness in the leg, from the knee down. The pain has been "intense" with standing/walking. The pain is "almost gone" when sitting.  NKI He tried some of his wife's gabapentin, which dulls the pain enough to sleep. Did have a prednisone taper initially -- no relief with this.     HPI: He is here with low back and right leg pain.  Symptoms started about 3 weeks ago, no definite injury.  He has had a burning pain and numbness from the posterior hip down the leg and into the foot.  Pain is worst when trying to stand up and walk, much better when sitting.  He has been using his wife's gabapentin with some relief.  Denies bowel or bladder dysfunction.  Denies any left-sided symptoms.  He had an MRI done recently and is here for additional recommendations.                ROS:   All other systems were reviewed and are negative.  Objective: Vital Signs: There were no vitals taken for this visit.  Physical Exam:  General:  Alert and oriented, in no acute distress. Pulm:  Breathing unlabored. Psy:  Normal mood, congruent affect.  Low back: He is tender to palpation in the right sciatic notch.  Straight leg raise is negative.  He has good hip range of motion with no significant pain.  Lower extremity strength and reflexes are normal.   Imaging: Lumbar MRI was reviewed with patient.  He has left-sided findings at L1 to with a disc extrusion and foraminal narrowing.  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  On the right he has L4-5 disc extrusion affecting the L4 and possibly  the L5 nerve roots.  There is moderate to severe right lateral recess stenosis at L3-4.   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 -Discussed options and elected to try physical therapy, gabapentin.   -He is also referred for epidural steroid injection. -If symptoms persist, then surgical consult.     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   Vitamin D deficiency 12/15/2015   Diabetes type 2, controlled ([place]) 09/16/2015   Past Medical History:  Diagnosis Date   Allergy    Anxiety    Colon polyp    Diabetes mellitus ([place])    Hyperlipidemia    Seasonal allergies    Vitamin D deficiency     Family History  Problem Relation Age of Onset   Emphysema Mother    Heart disease Father    Stroke Brother    Alzheimer's disease Brother    Alzheimer's disease Maternal Grandmother    Colon cancer Neg Hx    Esophageal cancer Neg Hx    Rectal cancer Neg Hx  Stomach cancer Neg Hx     Past Surgical History:  Procedure Laterality Date   CATARACT EXTRACTION  2014   bilateral    CERVICAL SPINE SURGERY  2003   herniated disk    COLONOSCOPY     HERNIA REPAIR     PARS PLANA VITRECTOMY W/ REPAIR OF MACULAR HOLE  2013   POLYPECTOMY     ROTATOR CUFF REPAIR  2006   left shoulder   VASECTOMY     1982   Social History   Occupational History   Not on file  Tobacco Use   Smoking status: Never   Smokeless tobacco: Never  Vaping Use   Vaping Use: Never used  Substance and Sexual Activity   Alcohol use: Yes    Alcohol/week: 0.0 standard drinks    Comment: one drink nightly    Drug use: No   Sexual activity: Yes

## 2021-04-26 ENCOUNTER — Encounter (HOSPITAL_BASED_OUTPATIENT_CLINIC_OR_DEPARTMENT_OTHER): Payer: Self-pay | Admitting: Physical Therapy

## 2021-04-26 ENCOUNTER — Ambulatory Visit (HOSPITAL_BASED_OUTPATIENT_CLINIC_OR_DEPARTMENT_OTHER): Payer: Medicare Other | Attending: Family Medicine | Admitting: Physical Therapy

## 2021-04-26 ENCOUNTER — Telehealth: Payer: Self-pay | Admitting: Adult Health

## 2021-04-26 ENCOUNTER — Other Ambulatory Visit: Payer: Self-pay

## 2021-04-26 DIAGNOSIS — R262 Difficulty in walking, not elsewhere classified: Secondary | ICD-10-CM | POA: Diagnosis not present

## 2021-04-26 DIAGNOSIS — M5441 Lumbago with sciatica, right side: Secondary | ICD-10-CM | POA: Diagnosis not present

## 2021-04-26 NOTE — Telephone Encounter (Signed)
Pt call and stated he don't want to do that .

## 2021-04-26 NOTE — Telephone Encounter (Signed)
Left message for patient to call back and schedule Medicare Annual Wellness Visit (AWV) either virtually or in office.   Last AWV ;12/15/15  please schedule at anytime with [facility] Nurse Health Advisor 1 or 2   This should be a 45 minute visit.

## 2021-04-26 NOTE — Therapy (Signed)
[facility] [address] [place], [place], [address] Phone: [phone]   Fax:  [phone]  Physical Therapy Evaluation  Patient Details  Name: Jeffrey Spears MRN: [id number] Date of Birth: 1946/06/17 Referring Provider (PT): Eunice Blase, MD   Encounter Date: 04/26/2021   PT End of Session - 04/26/21 1310     Visit Number 1    Number of Visits 12    Date for PT Re-Evaluation 05/31/21    Authorization Type Medicare A&B    Progress Note Due on Visit 10    PT Start Time [id number]    PT Stop Time 1032    PT Time Calculation (min) 45 min    Activity Tolerance Patient tolerated treatment well    Behavior During Therapy [facility] for tasks assessed/performed             Past Medical History:  Diagnosis Date   Allergy    Anxiety    Colon polyp    Diabetes mellitus ([place])    Hyperlipidemia    Seasonal allergies    Vitamin D deficiency     Past Surgical History:  Procedure Laterality Date   CATARACT EXTRACTION  2014   bilateral    CERVICAL SPINE SURGERY  2003   herniated disk    COLONOSCOPY     HERNIA REPAIR     PARS PLANA VITRECTOMY W/ REPAIR OF MACULAR HOLE  2013   POLYPECTOMY     ROTATOR CUFF REPAIR  2006   left shoulder   VASECTOMY     1982    There were no vitals filed for this visit.    Subjective Assessment - 04/26/21 0952     Subjective Pt reports pain beginning approx 03/09/21 with no specific MOI.  Pt states the week prior he was bending over and stretching for something though had no pain.  Pt went to MD on 6/28 and was prescribed prednisone and celebrex.  Pt reports no improvement with those medications.  Pt had a MRI and was referred to Dr. Junius Roads.  Dr. Junius Roads referred pt for an ESI though pt has been contacted yet.  He feels fine with sitting though has pain and is limited with standing and walking.  Pt is limited with household chores.  Pt states his lumbar was bothering him initially though he doesn't have  much lumbar pain currently.  His pain is located in R knee and down shin.  He denies thigh pain.  He c/o's of numbness and ache in R shin.  Pt received a youtube video from Dr. Junius Roads for home exercises consisting of mckenzie exercises.  Pt tried it though had pain and stopped.  He wanted to wait and hold off on those exercises until he saw PT.    Pertinent History rotator cuff repair, cervical spine surgery for herniated disk (Pt reports no fusion), DM    Limitations Standing;Lifting;Walking    How long can you sit comfortably? Pt feels fine with sitting    How long can you stand comfortably? 15 mins    How long can you walk comfortably? 15 mins    Diagnostic tests MRI findings noted in chart:  1. Symptomatic level with regard to right side pain favored to be  L4-L5 where a foraminal disc extrusion affects the exiting right L4  and to a lesser extent descending right L5 nerve levels.  2. Moderate to severe right lateral recess stenosis also at L3-L4,  right L4 nerve level.  3. Moderate  to severe left L5 foraminal stenosis at L5-S1 in part  due to a small facet related synovial cyst.  4. Small left subarticular disc extrusion at L1-L2 without  convincing neural impingement    Patient Stated Goals improve walking and standing, to be pain free    Currently in Pain? Yes    Pain Score 4    worst:  8-9/10, best:  2-3/10   Pain Location Leg   Primarily in R shin, not having much pain in R sided lumbar   Pain Orientation Right    Pain Descriptors / Indicators Aching;Pins and needles    Pain Type --    Pain Radiating Towards Numbness in R shin, pain in knee and shin    Pain Onset --   approx 7 weeks ago   Aggravating Factors  standing, walking    Pain Relieving Factors sitting    Effect of Pain on Daily Activities standing, walking    Multiple Pain Sites No                [facility] PT Assessment - 04/26/21 0001       Assessment   Medical Diagnosis Acute R sided LBP with R sciatica    Referring  Provider (PT) Eunice Blase, MD    Onset Date/Surgical Date 03/09/21    Hand Dominance Right    Prior Therapy none for lumbar; prior for RCR      Precautions   Precautions None      Restrictions   Weight Bearing Restrictions No      Balance Screen   Has the patient fallen in the past 6 months No      [place] residence    Additional Comments 1 story home with 1 small step      Prior Function   Level of Independence Independent    Vocation Retired      Associate Professor   Overall Cognitive Status Within Functional Limits for tasks assessed      Sensation   Light Touch --      Coordination   Gross Motor Movements are Fluid and Coordinated Yes    Fine Motor Movements are Fluid and Coordinated --      Posture/Postural Control   Posture Comments level IC.  Increased WB'ing on L LE.  Increased pain with WB'ing on  R LE.      ROM / Strength   AROM / PROM / Strength AROM;PROM;Strength      AROM   AROM Assessment Site Hip;Lumbar    Lumbar Flexion WFL   increase right knee/shin pain   Lumbar Extension limited, 25% of N    Lumbar - Right Side Bend 40% with pain    Lumbar - Left Side Bend WFL    Lumbar - Right Rotation 50%    Lumbar - Left Rotation [facility]      Strength   Strength Assessment Site Hip;Knee;Ankle    Right/Left Hip Right;Left    Right Hip Flexion 5/5    Left Hip Flexion 5/5    Right/Left Knee Left;Right    Right Knee Extension 4+/5    Left Knee Extension 5/5    Right/Left Ankle Left;Right    Right Ankle Dorsiflexion 5/5    Right Ankle Plantar Flexion --   WFL sitting   Left Ankle Dorsiflexion 5/5    Left Ankle Plantar Flexion --   WFL sitting     Palpation   Palpation comment No TTP in R  lumbar flank      Special Tests    Special Tests Lumbar;Hip Special Tests    Other special tests Pt reports improved sx's and pain with prone lying and Prone prop up.      Straight Leg Raise   Side  Right    Comment stretch pain  bilateral; negative      Ambulation/Gait   Ambulation/Gait Yes                        Objective measurements completed on examination: See above findings.       [facility] Adult PT Treatment/Exercise - 04/26/21 0001       Ambulation/Gait   Gait Comments R lean, limited pelvic rotation, limited reciprocal arm swing      Exercises   Exercises Lumbar   Educated patient on Mckenzie Method, red light/green light precautions, anatomy, dx and PT POC.  Instructed pt to avoid bending. Pt was given a HEP handout and educated in correct form and appropriate frequency and duration.  Answered Pt's questions.     Lumbar Exercises: Prone   Other Prone Lumbar Exercises Prone lying and prone prop up x 2 mins each. Attempted PPU though pt unable to perform thru a good ROM and had discomfort.                    PT Education - 04/26/21 1306     Education Details Educated patient on Mckenzie Method exercise for patient.  Instructed pt in red light/green light precautions and instructed pt to stop exercises if having peripheralization of sx's.  eduated pt in centralization vs peripheralization phenomenon.  Pt was given a handout of prone lying and prop up and educated pt in correct form and appropriate frequency and duration.  Patient was also educated on anatomy of spine, disk, related pathologies.  Educated pt concerning POC and answered Pt's questions.  Instructed pt to not perform bending and explained to pt why.    Person(s) Educated Patient    Methods Demonstration;Explanation;Tactile cues;Handout;Verbal cues    Comprehension Verbalized understanding;Returned demonstration;Verbal cues required;Tactile cues required              PT Short Term Goals - 04/26/21 1357       PT SHORT TERM GOAL #1   Title Patient will report at least a 25% improvement in shin and R knee pain and sx's for improved radicular sx's for improved standing duration and walking tolerance.    Baseline  --    Time 3    Period Weeks    Status New    Target Date 05/17/21      PT SHORT TERM GOAL #2   Title Patient will improve lumbar extension to 50% within normal limits.    Baseline 25%    Time 4    Period Weeks    Status New    Target Date 05/24/21      PT SHORT TERM GOAL #3   Title Patient will reported decrease left weight shift with prolonged standing.    Baseline Left shift    Time 3    Period Weeks    Status New    Target Date 05/17/21      PT SHORT TERM GOAL #4   Title Patient will have full understanding of inital HEP for improved pain, function, and tolerance with activity.    Baseline n/a    Time 3    Period Weeks  Status New    Target Date 05/17/21               PT Long Term Goals - 04/26/21 1410       PT LONG TERM GOAL #1   Title Patient will reported decrease sciatic pain (0-1/10) on lateral knee and shin with ambulation around community.    Baseline 4/10    Time 6    Period Weeks    Status New    Target Date 06/07/21      PT LONG TERM GOAL #2   Title Patient will show equal rotation in right and left lumbar rotation in order to peform ADL's correctly.    Baseline 50% WFL for right lumbar rotation, WFL for left lumbar rotation.    Time 6    Period Weeks    Status New    Target Date 06/07/21      PT LONG TERM GOAL #3   Title Patient will show lumbar extension at 90-95% of normal functional limits for improved mobility and in order to improve posture with daily activities.    Baseline 25%    Time 6    Period Weeks    Status New    Target Date 06/07/21      PT LONG TERM GOAL #4   Title Patient will be independent with final HEP in order to complete HEP at home for improved mobility, pain, strength, and function.    Baseline initial HEP    Time 6    Period Weeks    Status New    Target Date 06/07/21      PT LONG TERM GOAL #5   Title Pt will be able to perform his normal community ambulation without significant pain or limitation.     Time 6    Period Weeks    Status New    Target Date 06/07/21      Additional Long Term Goals   Additional Long Term Goals Yes      PT LONG TERM GOAL #6   Title Pt will demo proper squat to lift/hip hinge body mechanics for reduced stress and strain on lumbar with functional lifting.    Time 6    Period Weeks    Status New    Target Date 06/07/21                    Plan - 04/26/21 1310     Clinical Impression Statement Patient is a 75 y/o male with a dx of acute R sided LBP with R sciatica reporting pain for approx 7 weeks.  He was having back pain initially though not much currently but is having radicular sx's in R knee and shine.  Patient did not report any sciatic pain in posterior hamstring, calf, or foot at initial visit. Patients MRI shows "L4-5 disc extrusion affecting the L4 and possibly the L5 nerve roots." Patient had negative straight leg raise on both legs. Patient demonstrates deficits in gait mobility and mechanic and c/o's of limited ambulation due to pain.  Pt has reduced pain in sitting and increased pain in standing.  He has limited lumbar AROM.  Pt reports improved radicular pain and sx's with prone lying and prone prop up.  Sx's did return after standing for a little while.  Pt should benefit from skilled PT services to improve pain and sx's, ROM, strength, function, and mobility.    Personal Factors and Comorbidities Comorbidity 1    Comorbidities diabetes  type 2    Examination-Activity Limitations Lift;Locomotion Level;Squat;Stairs;Stand    Examination-Participation Restrictions Community Activity    Stability/Clinical Decision Making Stable/Uncomplicated    Clinical Decision Making Low    Rehab Potential Good    PT Frequency 2x / week    PT Duration 6 weeks    PT Treatment/Interventions ADLs/Self Care Home Management;Aquatic Therapy;Gait training;Functional mobility training;Therapeutic activities;Therapeutic exercise;Traction;Neuromuscular  re-education;Patient/family education;Manual techniques;Passive range of motion;Electrical Stimulation;Cryotherapy;Moist Heat    PT Next Visit Plan Re-examine Mckenzie Method exercises, manuel therapy to reduce muscle tightness, thera-ex to improve mobility and range of motion in lumbar, thera-ex to increase LE strength.  Give Oswestry next visit.    PT Home Exercise Plan Pt given handout of prone lying and prone prop up.    Consulted and Agree with Plan of Care Patient             Patient will benefit from skilled therapeutic intervention in order to improve the following deficits and impairments:  Abnormal gait, Decreased activity tolerance, Decreased strength, Decreased mobility, Difficulty walking, Postural dysfunction, Pain  Visit Diagnosis: Acute right-sided low back pain with right-sided sciatica - Plan: PT plan of care cert/re-cert  Difficulty in walking, not elsewhere classified - Plan: PT plan of care cert/re-cert     Problem List 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 [place], [place], [address] Phone: [phone]   Fax:  [phone]  Name: Jeffrey Spears MRN: [id number] Date of Birth: 1945/11/13

## 2021-04-27 NOTE — Telephone Encounter (Signed)
Documented on spreadsheet 

## 2021-04-30 ENCOUNTER — Encounter (HOSPITAL_BASED_OUTPATIENT_CLINIC_OR_DEPARTMENT_OTHER): Payer: Self-pay | Admitting: Physical Therapy

## 2021-04-30 ENCOUNTER — Ambulatory Visit (HOSPITAL_BASED_OUTPATIENT_CLINIC_OR_DEPARTMENT_OTHER): Payer: Medicare Other | Admitting: Physical Therapy

## 2021-04-30 ENCOUNTER — Telehealth: Payer: Self-pay | Admitting: Physical Medicine and Rehabilitation

## 2021-04-30 ENCOUNTER — Other Ambulatory Visit: Payer: Self-pay

## 2021-04-30 DIAGNOSIS — R262 Difficulty in walking, not elsewhere classified: Secondary | ICD-10-CM

## 2021-04-30 DIAGNOSIS — M5441 Lumbago with sciatica, right side: Secondary | ICD-10-CM

## 2021-04-30 NOTE — Therapy (Addendum)
[facility] [address] [place], [place], [address] Phone: [phone]   Fax:  [phone]  Physical Therapy Treatment  Patient Details  Name: Jeffrey Spears MRN: [id number] Date of Birth: 10/16/1945 Referring Provider (PT): Eunice Blase, MD   Encounter Date: 04/30/2021   PT End of Session - 04/30/21 0906     Visit Number 2    Number of Visits 12    Date for PT Re-Evaluation 05/31/21    Authorization Type Medicare A&B    Progress Note Due on Visit 10    PT Start Time 0855    PT Stop Time 0933    PT Time Calculation (min) 38 min    Activity Tolerance Patient tolerated treatment well    Behavior During Therapy [facility] for tasks assessed/performed             Past Medical History:  Diagnosis Date   Allergy    Anxiety    Colon polyp    Diabetes mellitus ([place])    Hyperlipidemia    Seasonal allergies    Vitamin D deficiency     Past Surgical History:  Procedure Laterality Date   CATARACT EXTRACTION  2014   bilateral    CERVICAL SPINE SURGERY  2003   herniated disk    COLONOSCOPY     HERNIA REPAIR     PARS PLANA VITRECTOMY W/ REPAIR OF MACULAR HOLE  2013   POLYPECTOMY     ROTATOR CUFF REPAIR  2006   left shoulder   VASECTOMY     1982    There were no vitals filed for this visit.   Subjective Assessment - 04/30/21 0857     Subjective Dr. Junius Roads referred pt for an ESI though pt has been contacted yet.  He feels fine with sitting though has pain and is limited with standing and walking.  Pt is limited with household chores.  Pt states his lumbar was bothering him initially though he doesn't have much lumbar pain currently.  His pain is located in R knee and down shin.  He denies thigh pain.  He c/o's of numbness and ache in R shin.  Pt has been compliant with HEP.  H reports improved sx's with prone exercsises though his sx's return.    Pertinent History rotator cuff repair, cervical spine surgery for herniated  disk (Pt reports no fusion), DM    How long can you sit comfortably? Pt feels fine with sitting    How long can you stand comfortably? 15 mins    How long can you walk comfortably? 15 mins    Diagnostic tests MRI findings noted in chart:  1. Symptomatic level with regard to right side pain favored to be  L4-L5 where a foraminal disc extrusion affects the exiting right L4  and to a lesser extent descending right L5 nerve levels.  2. Moderate to severe right lateral recess stenosis also at L3-L4,  right L4 nerve level.  3. Moderate to severe left L5 foraminal stenosis at L5-S1 in part  due to a small facet related synovial cyst.  4. Small left subarticular disc extrusion at L1-L2 without  convincing neural impingement    Currently in Pain? Yes    Pain Score 4     Pain Location --   R Knee and shin   Pain Orientation Right    Aggravating Factors  standing, walking    Pain Relieving Factors sitting    Effect of Pain on Daily Activities  standing walking                               [facility] Adult PT Treatment/Exercise - 04/30/21 0001       Therapeutic Activites    Therapeutic Activities Other Therapeutic Activities    Other Therapeutic Activities Educated pt in and demontrated using hip hinge technique/squat to lift technique.  pt performed hip hinge technique with picking up a cone of high/low table.      Exercises   Exercises Lumbar   Reviewed response to prior Rx, HEP compliance, current function, and pain level.     Lumbar Exercises: Supine   Other Supine Lumbar Exercises supine TrA contraction with 5 sec hold, heel slides with TrA 2x10, and bent knee fall out with trA 2x10.  Gave pt a handout of TrA contraction and educated pt with palpation and performance of TrA contraction.                    PT Education - 04/30/21 0915     Education Details Educated patient on Mckenzie Method exercise for patient. Instructed pt in red light/green light precautions and  instructed pt to stop exercises if having peripheralization of sx's. eduated pt in centralization vs peripheralization phenomenon.  Pt educated on anatomy of spine and disk.  Educated pt concerning POC and answered Pt's questions. Instructed pt to not perform bending and explained to pt why.  Educated pt in using hip hinge technique/squat to lift technique. Educated pt in correct performance and palpation of TrA contraction.  Gave pt a handout with TrA contraction.    Person(s) Educated Patient    Methods Explanation;Demonstration;Verbal cues;Handout    Comprehension Need further instruction;Verbal cues required;Returned demonstration;Verbalized understanding              PT Short Term Goals - 04/26/21 1357       PT SHORT TERM GOAL #1   Title Patient will report at least a 25% improvement in shin and R knee pain and sx's for improved radicular sx's for improved standing duration and walking tolerance.    Baseline --    Time 3    Period Weeks    Status New    Target Date 05/17/21      PT SHORT TERM GOAL #2   Title Patient will improve lumbar extension to 50% within normal limits.    Baseline 25%    Time 4    Period Weeks    Status New    Target Date 05/24/21      PT SHORT TERM GOAL #3   Title Patient will reported decrease left weight shift with prolonged standing.    Baseline Left shift    Time 3    Period Weeks    Status New    Target Date 05/17/21      PT SHORT TERM GOAL #4   Title Patient will have full understanding of inital HEP for improved pain, function, and tolerance with activity.    Baseline n/a    Time 3    Period Weeks    Status New    Target Date 05/17/21               PT Long Term Goals - 04/26/21 1410       PT LONG TERM GOAL #1   Title Patient will reported decrease sciatic pain (0-1/10) on lateral knee and shin with ambulation around community.  Baseline 4/10    Time 6    Period Weeks    Status New    Target Date 06/07/21      PT LONG  TERM GOAL #2   Title Patient will show equal rotation in right and left lumbar rotation in order to peform ADL's correctly.    Baseline 50% WFL for right lumbar rotation, WFL for left lumbar rotation.    Time 6    Period Weeks    Status New    Target Date 06/07/21      PT LONG TERM GOAL #3   Title Patient will show lumbar extension at 90-95% of normal functional limits for improved mobility and in order to improve posture with daily activities.    Baseline 25%    Time 6    Period Weeks    Status New    Target Date 06/07/21      PT LONG TERM GOAL #4   Title Patient will be independent with final HEP in order to complete HEP at home for improved mobility, pain, strength, and function.    Baseline initial HEP    Time 6    Period Weeks    Status New    Target Date 06/07/21      PT LONG TERM GOAL #5   Title Pt will be able to perform his normal community ambulation without significant pain or limitation.    Time 6    Period Weeks    Status New    Target Date 06/07/21      Additional Long Term Goals   Additional Long Term Goals Yes      PT LONG TERM GOAL #6   Title Pt will demo proper squat to lift/hip hinge body mechanics for reduced stress and strain on lumbar with functional lifting.    Time 6    Period Weeks    Status New    Target Date 06/07/21                   Plan - 04/30/21 1550     Clinical Impression Statement Pt reports improved sx's with prone exercises though his sx's return.  Educated pt with hip hinge technique to squat to lift instead of bending and Pt practiced in the clinic.  He requires cuing and instruction for correct form with hip hinge and requires further instruction to perform it correctly.  PT answered Pt's questions and pt demonstrates good understanding.  Pt responded well to Rx though did report increased pain to 6/10 after Rx.  Pt should benefit from cont skilled PT services to improve pain, address ongoing goals, and to restore PLOF.     Comorbidities diabetes type 2    PT Treatment/Interventions ADLs/Self Care Home Management;Aquatic Therapy;Gait training;Functional mobility training;Therapeutic activities;Therapeutic exercise;Traction;Neuromuscular re-education;Patient/family education;Manual techniques;Passive range of motion;Electrical Stimulation;Cryotherapy;Moist Heat    PT Next Visit Plan Re-examine Mckenzie Method exercises, manuel therapy to reduce muscle tightness, thera-ex to improve mobility and range of motion in lumbar, thera-ex to increase LE strength.  Give Oswestry next visit.    PT Home Exercise Plan Pt given handout of prone lying and prone prop up and also TrA contraction.    Consulted and Agree with Plan of Care Patient             Patient will benefit from skilled therapeutic intervention in order to improve the following deficits and impairments:  Abnormal gait, Decreased activity tolerance, Decreased strength, Decreased mobility, Difficulty walking, Postural dysfunction, Pain  Visit Diagnosis: Acute right-sided low back pain with right-sided sciatica  Difficulty in walking, not elsewhere classified     Problem List 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   Vitamin D deficiency 12/15/2015   Diabetes type 2, controlled ([place]) 09/16/2015    Selinda Michaels III PT, DPT 04/30/21 3:56 PM   PHYSICAL THERAPY DISCHARGE SUMMARY  Visits from Start of Care: 2  Current functional level related to goals / functional outcomes: Unable to assess   Remaining deficits: Unable to assess   Education / Equipment: HEP   Patient was seen in PT on 04/26/21 and 04/30/2021.  Pt self discharged due to personal reasons.  Unable to assess current functional status or goals due to pt not present at discharge.  He has a HEP to continue with.   Selinda Michaels III PT, DPT 09/06/21 8:58 AM   [facility] [place], [place], [address] Phone: [phone]   Fax:  [phone]  Name: DELMONTE YERK MRN: [id number] Date of Birth: [birth year]

## 2021-04-30 NOTE — Telephone Encounter (Signed)
Patient's wife called. She would like him scheduled with Dr. Ernestina Patches. Her call back number is [phone]

## 2021-05-04 ENCOUNTER — Ambulatory Visit (HOSPITAL_BASED_OUTPATIENT_CLINIC_OR_DEPARTMENT_OTHER): Payer: Medicare Other | Admitting: Physical Therapy

## 2021-05-06 ENCOUNTER — Encounter (HOSPITAL_BASED_OUTPATIENT_CLINIC_OR_DEPARTMENT_OTHER): Payer: Medicare Other | Admitting: Physical Therapy

## 2021-05-06 ENCOUNTER — Ambulatory Visit (INDEPENDENT_AMBULATORY_CARE_PROVIDER_SITE_OTHER): Payer: Medicare Other | Admitting: Physical Medicine and Rehabilitation

## 2021-05-06 ENCOUNTER — Ambulatory Visit: Payer: Self-pay

## 2021-05-06 ENCOUNTER — Other Ambulatory Visit: Payer: Self-pay

## 2021-05-06 ENCOUNTER — Encounter: Payer: Self-pay | Admitting: Physical Medicine and Rehabilitation

## 2021-05-06 VITALS — BP 119/77 | HR 79

## 2021-05-06 DIAGNOSIS — M5416 Radiculopathy, lumbar region: Secondary | ICD-10-CM | POA: Diagnosis not present

## 2021-05-06 DIAGNOSIS — M5116 Intervertebral disc disorders with radiculopathy, lumbar region: Secondary | ICD-10-CM | POA: Diagnosis not present

## 2021-05-06 MED ORDER — METHYLPREDNISOLONE ACETATE 80 MG/ML IJ SUSP
80.0000 mg | Freq: Once | INTRAMUSCULAR | Status: AC
  Administered 2021-05-06: 80 mg

## 2021-05-06 NOTE — Patient Instructions (Signed)

## 2021-05-06 NOTE — Progress Notes (Signed)
Pt state lower back pain that travels down his right leg and shin. Pt state walking and standing makes the pain worse. Pt state it a tingling and burning feeling in his leg. Pt state he takes pain meds and PT to help ease his pain.  Numeric Pain Rating Scale and Functional Assessment Average Pain 6   In the last MONTH (on 0-10 scale) has pain interfered with the following?  1. General activity like being  able to carry out your everyday physical activities such as walking, climbing stairs, carrying groceries, or moving a chair?  Rating(9119/)   +Driver, -BT, -Dye Allergies.

## 2021-05-11 ENCOUNTER — Encounter (HOSPITAL_BASED_OUTPATIENT_CLINIC_OR_DEPARTMENT_OTHER): Payer: Medicare Other | Admitting: Physical Therapy

## 2021-05-14 ENCOUNTER — Encounter (HOSPITAL_BASED_OUTPATIENT_CLINIC_OR_DEPARTMENT_OTHER): Payer: Medicare Other | Admitting: Physical Therapy

## 2021-05-15 ENCOUNTER — Other Ambulatory Visit: Payer: Self-pay | Admitting: Adult Health

## 2021-05-15 DIAGNOSIS — F411 Generalized anxiety disorder: Secondary | ICD-10-CM

## 2021-05-18 ENCOUNTER — Ambulatory Visit (HOSPITAL_BASED_OUTPATIENT_CLINIC_OR_DEPARTMENT_OTHER): Payer: Medicare Other | Admitting: Physical Therapy

## 2021-05-20 ENCOUNTER — Ambulatory Visit (HOSPITAL_BASED_OUTPATIENT_CLINIC_OR_DEPARTMENT_OTHER): Payer: Medicare Other | Admitting: Physical Therapy

## 2021-05-24 NOTE — Procedures (Signed)
Lumbosacral Transforaminal Epidural Steroid Injection - Sub-Pedicular Approach with Fluoroscopic Guidance  Patient: Jeffrey Spears      Date of Birth: Feb 05, 1946 MRN: [id number] PCP: Dorothyann Peng, NP      Visit Date: 05/06/2021   Universal Protocol:    Date/Time: 05/06/2021  Consent Given By: the patient  Position: PRONE  Additional Comments: Vital signs were monitored before and after the procedure. Patient was prepped and draped in the usual sterile fashion. The correct patient, procedure, and site was verified.   Injection Procedure Details:   Procedure diagnoses: Lumbar radiculopathy [M54.16]    Meds Administered:  Meds ordered this encounter  Medications   methylPREDNISolone acetate (DEPO-MEDROL) injection 80 mg    Laterality: Right  Location/Site:  L4-L5  Needle:5.0 in., 22 ga.  Short bevel or Quincke spinal needle  Needle Placement: Transforaminal  Findings:    -Comments: Excellent flow of contrast along the nerve, nerve root and into the epidural space.  Procedure Details: After squaring off the end-plates to get a true AP view, the C-arm was positioned so that an oblique view of the foramen as noted above was visualized. The target area is just inferior to the "nose of the scotty dog" or sub pedicular. The soft tissues overlying this structure were infiltrated with 2-3 ml. of 1% Lidocaine without Epinephrine.  The spinal needle was inserted toward the target using a "trajectory" view along the fluoroscope beam.  Under AP and lateral visualization, the needle was advanced so it did not puncture dura and was located close the 6 O'Clock position of the pedical in AP tracterory. Biplanar projections were used to confirm position. Aspiration was confirmed to be negative for CSF and/or blood. A 1-2 ml. volume of Isovue-250 was injected and flow of contrast was noted at each level. Radiographs were obtained for documentation purposes.   After attaining the desired  flow of contrast documented above, a 0.5 to 1.0 ml test dose of 0.25% Marcaine was injected into each respective transforaminal space.  The patient was observed for 90 seconds post injection.  After no sensory deficits were reported, and normal lower extremity motor function was noted,   the above injectate was administered so that equal amounts of the injectate were placed at each foramen (level) into the transforaminal epidural space.   Additional Comments:  The patient tolerated the procedure well Dressing: 2 x 2 sterile gauze and Band-Aid    Post-procedure details: Patient was observed during the procedure. Post-procedure instructions were reviewed.  Patient left the clinic in stable condition.

## 2021-05-24 NOTE — Progress Notes (Signed)
Jeffrey Spears - 75 y.o. male MRN [id number]  Date of birth: 1946-01-27  Office Visit Note: Visit Date: 05/06/2021 PCP: Dorothyann Peng, NP Referred by: Dorothyann Peng, NP  Subjective: Chief Complaint  Patient presents with   Lower Back - Pain   Right Leg - Pain   HPI:  [name] is a 75 y.o. male who comes in today at the request of Dr. Eunice Blase for planned Right L4-L5 Lumbar Transforaminal epidural steroid injection with fluoroscopic guidance.  The patient has failed conservative care including home exercise, medications, time and activity modification.  This injection will be diagnostic and hopefully therapeutic.  Please see requesting physician notes for further details and justification. MRI reviewed with images and spine model.  MRI reviewed in the note below.     ROS Otherwise per HPI.  Assessment & Plan: Visit Diagnoses:    ICD-10-CM   1. Lumbar radiculopathy  M54.16 XR C-ARM NO REPORT    Epidural Steroid injection    methylPREDNISolone acetate (DEPO-MEDROL) injection 80 mg    2. Radiculopathy due to lumbar intervertebral disc disorder  M51.16 XR C-ARM NO REPORT    Epidural Steroid injection    methylPREDNISolone acetate (DEPO-MEDROL) injection 80 mg      Plan: No additional findings.   Meds & Orders:  Meds ordered this encounter  Medications   methylPREDNISolone acetate (DEPO-MEDROL) injection 80 mg    Orders Placed This Encounter  Procedures   XR C-ARM NO REPORT   Epidural Steroid injection    Follow-up: Return if symptoms worsen or fail to improve.   Procedures: No procedures performed  Lumbosacral Transforaminal Epidural Steroid Injection - Sub-Pedicular Approach with Fluoroscopic Guidance  Patient: [name]      Date of Birth: Jan 19, 1946 MRN: [id number] PCP: Dorothyann Peng, NP      Visit Date: 05/06/2021   Universal Protocol:    Date/Time: 05/06/2021  Consent Given By: the patient  Position: PRONE  Additional Comments: Vital  signs were monitored before and after the procedure. Patient was prepped and draped in the usual sterile fashion. The correct patient, procedure, and site was verified.   Injection Procedure Details:   Procedure diagnoses: Lumbar radiculopathy [M54.16]    Meds Administered:  Meds ordered this encounter  Medications   methylPREDNISolone acetate (DEPO-MEDROL) injection 80 mg    Laterality: Right  Location/Site:  L4-L5  Needle:5.0 in., 22 ga.  Short bevel or Quincke spinal needle  Needle Placement: Transforaminal  Findings:    -Comments: Excellent flow of contrast along the nerve, nerve root and into the epidural space.  Procedure Details: After squaring off the end-plates to get a true AP view, the C-arm was positioned so that an oblique view of the foramen as noted above was visualized. The target area is just inferior to the "nose of the scotty dog" or sub pedicular. The soft tissues overlying this structure were infiltrated with 2-3 ml. of 1% Lidocaine without Epinephrine.  The spinal needle was inserted toward the target using a "trajectory" view along the fluoroscope beam.  Under AP and lateral visualization, the needle was advanced so it did not puncture dura and was located close the 6 O'Clock position of the pedical in AP tracterory. Biplanar projections were used to confirm position. Aspiration was confirmed to be negative for CSF and/or blood. A 1-2 ml. volume of Isovue-250 was injected and flow of contrast was noted at each level. Radiographs were obtained for documentation purposes.   After attaining the desired  flow of contrast documented above, a 0.5 to 1.0 ml test dose of 0.25% Marcaine was injected into each respective transforaminal space.  The patient was observed for 90 seconds post injection.  After no sensory deficits were reported, and normal lower extremity motor function was noted,   the above injectate was administered so that equal amounts of the injectate  were placed at each foramen (level) into the transforaminal epidural space.   Additional Comments:  The patient tolerated the procedure well Dressing: 2 x 2 sterile gauze and Band-Aid    Post-procedure details: Patient was observed during the procedure. Post-procedure instructions were reviewed.  Patient left the clinic in stable condition.    Clinical History: MRI LUMBAR SPINE WITHOUT CONTRAST   TECHNIQUE: Multiplanar, multisequence MR imaging of the lumbar spine was performed. No intravenous contrast was administered.   COMPARISON: None.   FINDINGS: Segmentation: Lumbar segmentation appears to be normal and will be designated as such for this report.   Alignment: Straightening of lumbar lordosis. Mild dextroconvex lumbar scoliosis. No spondylolisthesis.   Vertebrae: Degenerative marrow edema in the left side posterior elements of L5-S1 (series 3, image 13). See additional details of that level below. Background bone marrow signal is mildly heterogeneous but within normal limits. No suspicious marrow lesion. Visible sacrum and SI joints appear intact.   Conus medullaris and cauda equina: Conus extends to the T12-L1 level. No lower spinal cord or conus signal abnormality.   Paraspinal and other soft tissues: Partially visible benign appearing renal cysts. Other visualized abdominal viscera and paraspinal soft tissues are within normal limits.   Disc levels:   Negative visible lower thoracic levels through T12-L1.   L1-L2: Left subarticular disc extrusion with a roughly 8 mm disc fragment (series 2, image 11). But only mild left lateral recess and left foraminal involvement. Mild facet hypertrophy. No spinal stenosis.   L2-L3: Circumferential disc bulge. No significant stenosis.   L3-L4: Disc or disc osteophyte complex involving the right lateral recess with moderate to severe stenosis (descending right L4 nerve level series 5, image 25). Underlying mild disc  bulging and facet hypertrophy. Mild overall spinal stenosis. And mild to moderate right L3 foraminal stenosis.   L4-L5: Rightward circumferential disc bulge with superimposed subarticular and foraminal disc extrusion (series 5, image 30). Mild facet and ligament flavum hypertrophy. Moderate right lateral recess stenosis (right L5 nerve level) and severe right foraminal stenosis (right L4 nerve level). No significant spinal stenosis.   L5-S1: Moderate facet hypertrophy greater on the left. Degenerative facet joint fluid and marrow edema. Small posteriorly situated synovial cysts which should not cause neural compromise (series 3, image 12) but there is also a small 5-6 mm synovial cyst projecting into the left L5 neural foramen with moderate to severe stenosis when combined with mild disc bulging and endplate spurring (series 2, image 12). No spinal or lateral recess stenosis.   IMPRESSION: 1. Symptomatic level with regard to right side pain favored to be L4-L5 where a foraminal disc extrusion affects the exiting right L4 and to a lesser extent descending right L5 nerve levels. 2. Moderate to severe right lateral recess stenosis also at L3-L4, right L4 nerve level. 3. Moderate to severe left L5 foraminal stenosis at L5-S1 in part due to a small facet related synovial cyst. 4. Small left subarticular disc extrusion at L1-L2 without convincing neural impingement.     Electronically Signed By: Genevie Ann M.D. On: 04/16/2021 12:31     Objective:  VS:  HT:  WT:   BMI:     BP:119/77  HR:79bpm  TEMP: ( )  RESP:  Physical Exam Vitals and nursing note reviewed.  Constitutional:      General: He is not in acute distress.    Appearance: Normal appearance. He is not ill-appearing.  HENT:     Head: Normocephalic and atraumatic.     Right Ear: External ear normal.     Left Ear: External ear normal.     Nose: No congestion.  Eyes:     Extraocular Movements: Extraocular movements  intact.  Cardiovascular:     Rate and Rhythm: Normal rate.     Pulses: Normal pulses.  Pulmonary:     Effort: Pulmonary effort is normal. No respiratory distress.  Abdominal:     General: There is no distension.     Palpations: Abdomen is soft.  Musculoskeletal:        General: No tenderness or signs of injury.     Cervical back: Neck supple.     Right lower leg: No edema.     Left lower leg: No edema.     Comments: Patient has good distal strength without clonus.  Skin:    Findings: No erythema or rash.  Neurological:     General: No focal deficit present.     Mental Status: He is alert and oriented to person, place, and time.     Sensory: No sensory deficit.     Motor: No weakness or abnormal muscle tone.     Coordination: Coordination normal.  Psychiatric:        Mood and Affect: Mood normal.        Behavior: Behavior normal.     Imaging: No results found.

## 2021-05-25 ENCOUNTER — Encounter (HOSPITAL_BASED_OUTPATIENT_CLINIC_OR_DEPARTMENT_OTHER): Payer: Medicare Other | Admitting: Physical Therapy

## 2021-05-28 ENCOUNTER — Encounter (HOSPITAL_BASED_OUTPATIENT_CLINIC_OR_DEPARTMENT_OTHER): Payer: Medicare Other | Admitting: Physical Therapy

## 2021-06-06 ENCOUNTER — Other Ambulatory Visit: Payer: Self-pay | Admitting: Adult Health

## 2021-06-06 DIAGNOSIS — F411 Generalized anxiety disorder: Secondary | ICD-10-CM

## 2021-06-21 ENCOUNTER — Telehealth: Payer: Self-pay | Admitting: *Deleted

## 2021-06-21 DIAGNOSIS — T162XXA Foreign body in left ear, initial encounter: Secondary | ICD-10-CM | POA: Diagnosis not present

## 2021-06-21 NOTE — Telephone Encounter (Signed)
Left a message on patients machine to call back and schedule medicare wellness visit.

## 2021-07-15 ENCOUNTER — Other Ambulatory Visit: Payer: Self-pay | Admitting: Adult Health

## 2021-08-08 ENCOUNTER — Other Ambulatory Visit: Payer: Self-pay | Admitting: Adult Health

## 2021-08-10 NOTE — Telephone Encounter (Signed)
Pt needs a DM visit for further refills

## 2021-08-11 ENCOUNTER — Telehealth: Payer: Medicare Other | Admitting: Physical Medicine and Rehabilitation

## 2021-08-11 DIAGNOSIS — M5416 Radiculopathy, lumbar region: Secondary | ICD-10-CM

## 2021-08-11 NOTE — Telephone Encounter (Signed)
Right L4 TF on 05/06/21. Ok to repeat if helped, same problem/side, and no new injury?

## 2021-08-11 NOTE — Telephone Encounter (Signed)
Pt called stating he got an injection on 05/06/21 and worked really well but it's worn off. Pt would like to know if he could get a repeat injection; he would like a CB.   [phone]

## 2021-08-12 ENCOUNTER — Telehealth: Payer: Self-pay | Admitting: Physical Medicine and Rehabilitation

## 2021-08-12 NOTE — Telephone Encounter (Signed)
Pt called requesting a call back to set an appt. Please call pt at [phone] for injection.

## 2021-08-12 NOTE — Telephone Encounter (Signed)
Scheduled and referral placed.

## 2021-08-12 NOTE — Telephone Encounter (Signed)
See previous message

## 2021-08-13 ENCOUNTER — Telehealth: Payer: Self-pay | Admitting: Physical Medicine and Rehabilitation

## 2021-08-13 NOTE — Telephone Encounter (Signed)
Patient's wife Jeffrey Spears called advised her husband is her caregiver and he is in so much pain it's hard for him to take care of her. Gaylene asked if there is anyway he can get an earlier appointment with Dr Ernestina Patches? Gaylene said she had brain cancer and he helps her a lot. The number to contact Gaylene is [phone]

## 2021-08-13 NOTE — Telephone Encounter (Signed)
Added appointment to wait list. Left message advising patient's wife to call us back if she wants [place] to refer him to [place].

## 2021-08-24 ENCOUNTER — Ambulatory Visit (INDEPENDENT_AMBULATORY_CARE_PROVIDER_SITE_OTHER): Payer: Medicare Other | Admitting: Physical Medicine and Rehabilitation

## 2021-08-24 ENCOUNTER — Ambulatory Visit: Payer: Medicare Other

## 2021-08-24 ENCOUNTER — Encounter: Payer: Self-pay | Admitting: Physical Medicine and Rehabilitation

## 2021-08-24 ENCOUNTER — Other Ambulatory Visit: Payer: Self-pay

## 2021-08-24 VITALS — BP 117/75 | HR 87

## 2021-08-24 DIAGNOSIS — M5416 Radiculopathy, lumbar region: Secondary | ICD-10-CM | POA: Diagnosis not present

## 2021-08-24 DIAGNOSIS — M5116 Intervertebral disc disorders with radiculopathy, lumbar region: Secondary | ICD-10-CM | POA: Diagnosis not present

## 2021-08-24 MED ORDER — METHYLPREDNISOLONE ACETATE 80 MG/ML IJ SUSP
80.0000 mg | Freq: Once | INTRAMUSCULAR | Status: AC
  Administered 2021-08-24: 80 mg

## 2021-08-24 NOTE — Progress Notes (Signed)
Pt state lower back pain that travels down his right leg and shin. Pt state walking and standing makes the pain worse. Pt state it a tingling and burning feeling in his leg. Pt state he takes pain meds and PT to help ease his pain. Pt has hx of injo on 05/06/21 pt state it helped.  Numeric Pain Rating Scale and Functional Assessment Average Pain 7   In the last MONTH (on 0-10 scale) has pain interfered with the following?  1. General activity like being  able to carry out your everyday physical activities such as walking, climbing stairs, carrying groceries, or moving a chair?  Rating(9)   +Driver, -BT, -Dye Allergies.

## 2021-08-24 NOTE — Patient Instructions (Signed)

## 2021-08-24 NOTE — Procedures (Signed)
Lumbosacral Transforaminal Epidural Steroid Injection - Sub-Pedicular Approach with Fluoroscopic Guidance  Patient: Jeffrey Spears      Date of Birth: October 10, 1945 MRN: [id number] PCP: Dorothyann Peng, NP      Visit Date: 08/24/2021   Universal Protocol:    Date/Time: 08/24/2021  Consent Given By: the patient  Position: PRONE  Additional Comments: Vital signs were monitored before and after the procedure. Patient was prepped and draped in the usual sterile fashion. The correct patient, procedure, and site was verified.   Injection Procedure Details:   Procedure diagnoses: Lumbar radiculopathy [M54.16]    Meds Administered:  Meds ordered this encounter  Medications   methylPREDNISolone acetate (DEPO-MEDROL) injection 80 mg    Laterality: Right  Location/Site: L4  Needle:5.0 in., 22 ga.  Short bevel or Quincke spinal needle  Needle Placement: Transforaminal  Findings:    -Comments: Excellent flow of contrast along the nerve, nerve root and into the epidural space. Post procedure he had some numbness in the right leg.  Procedure Details: After squaring off the end-plates to get a true AP view, the C-arm was positioned so that an oblique view of the foramen as noted above was visualized. The target area is just inferior to the "nose of the scotty dog" or sub pedicular. The soft tissues overlying this structure were infiltrated with 2-3 ml. of 1% Lidocaine without Epinephrine.  The spinal needle was inserted toward the target using a "trajectory" view along the fluoroscope beam.  Under AP and lateral visualization, the needle was advanced so it did not puncture dura and was located close the 6 O'Clock position of the pedical in AP tracterory. Biplanar projections were used to confirm position. Aspiration was confirmed to be negative for CSF and/or blood. A 1-2 ml. volume of Isovue-250 was injected and flow of contrast was noted at each level. Radiographs were obtained for  documentation purposes.   After attaining the desired flow of contrast documented above, a 0.5 to 1.0 ml test dose of 0.25% Marcaine was injected into each respective transforaminal space.  The patient was observed for 90 seconds post injection.  After no sensory deficits were reported, and normal lower extremity motor function was noted,   the above injectate was administered so that equal amounts of the injectate were placed at each foramen (level) into the transforaminal epidural space.   Additional Comments:  No complications occurred Dressing: 2 x 2 sterile gauze and Band-Aid    Post-procedure details: Patient was observed during the procedure. Post-procedure instructions were reviewed.  Patient left the clinic in stable condition.

## 2021-08-24 NOTE — Progress Notes (Signed)
Jeffrey Spears - 75 y.o. male MRN [id number]  Date of birth: March 13, 1946  Office Visit Note: Visit Date: 08/24/2021 PCP: Dorothyann Peng, NP Referred by: Dorothyann Peng, NP  Subjective: Chief Complaint  Patient presents with   Lower Back - Pain   Right Leg - Pain   HPI:  Jeffrey Spears is a 75 y.o. male who comes in today for planned repeat Right L4-5  Lumbar Transforaminal epidural steroid injection with fluoroscopic guidance.  The patient has failed conservative care including home exercise, medications, time and activity modification.  This injection will be diagnostic and hopefully therapeutic.  Please see requesting physician notes for further details and justification. Patient received more than 50% pain relief from prior injection.   Referring: Dr. Legrand Como Hilts   ROS Otherwise per HPI.  Assessment & Plan: Visit Diagnoses:    ICD-10-CM   1. Lumbar radiculopathy  M54.16 XR C-ARM NO REPORT    Epidural Steroid injection    methylPREDNISolone acetate (DEPO-MEDROL) injection 80 mg    2. Radiculopathy due to lumbar intervertebral disc disorder  M51.16 XR C-ARM NO REPORT    Epidural Steroid injection    methylPREDNISolone acetate (DEPO-MEDROL) injection 80 mg      Plan: No additional findings.   Meds & Orders:  Meds ordered this encounter  Medications   methylPREDNISolone acetate (DEPO-MEDROL) injection 80 mg    Orders Placed This Encounter  Procedures   XR C-ARM NO REPORT   Epidural Steroid injection    Follow-up: Return if symptoms worsen or fail to improve, for consider L5-S1 interlaminar injeciton.   Procedures: No procedures performed  Lumbosacral Transforaminal Epidural Steroid Injection - Sub-Pedicular Approach with Fluoroscopic Guidance  Patient: Jeffrey Spears      Date of Birth: 03/09/1946 MRN: [id number] PCP: Dorothyann Peng, NP      Visit Date: 08/24/2021   Universal Protocol:    Date/Time: 08/24/2021  Consent Given By: the patient  Position:  PRONE  Additional Comments: Vital signs were monitored before and after the procedure. Patient was prepped and draped in the usual sterile fashion. The correct patient, procedure, and site was verified.   Injection Procedure Details:   Procedure diagnoses: Lumbar radiculopathy [M54.16]    Meds Administered:  Meds ordered this encounter  Medications   methylPREDNISolone acetate (DEPO-MEDROL) injection 80 mg    Laterality: Right  Location/Site: L4  Needle:5.0 in., 22 ga.  Short bevel or Quincke spinal needle  Needle Placement: Transforaminal  Findings:    -Comments: Excellent flow of contrast along the nerve, nerve root and into the epidural space. Post procedure he had some numbness in the right leg.  Procedure Details: After squaring off the end-plates to get a true AP view, the C-arm was positioned so that an oblique view of the foramen as noted above was visualized. The target area is just inferior to the "nose of the scotty dog" or sub pedicular. The soft tissues overlying this structure were infiltrated with 2-3 ml. of 1% Lidocaine without Epinephrine.  The spinal needle was inserted toward the target using a "trajectory" view along the fluoroscope beam.  Under AP and lateral visualization, the needle was advanced so it did not puncture dura and was located close the 6 O'Clock position of the pedical in AP tracterory. Biplanar projections were used to confirm position. Aspiration was confirmed to be negative for CSF and/or blood. A 1-2 ml. volume of Isovue-250 was injected and flow of contrast was noted at each level. Radiographs were obtained  for documentation purposes.   After attaining the desired flow of contrast documented above, a 0.5 to 1.0 ml test dose of 0.25% Marcaine was injected into each respective transforaminal space.  The patient was observed for 90 seconds post injection.  After no sensory deficits were reported, and normal lower extremity motor function was  noted,   the above injectate was administered so that equal amounts of the injectate were placed at each foramen (level) into the transforaminal epidural space.   Additional Comments:  No complications occurred Dressing: 2 x 2 sterile gauze and Band-Aid    Post-procedure details: Patient was observed during the procedure. Post-procedure instructions were reviewed.  Patient left the clinic in stable condition.   Clinical History: MRI LUMBAR SPINE WITHOUT CONTRAST   TECHNIQUE: Multiplanar, multisequence MR imaging of the lumbar spine was performed. No intravenous contrast was administered.   COMPARISON: None.   FINDINGS: Segmentation: Lumbar segmentation appears to be normal and will be designated as such for this report.   Alignment: Straightening of lumbar lordosis. Mild dextroconvex lumbar scoliosis. No spondylolisthesis.   Vertebrae: Degenerative marrow edema in the left side posterior elements of L5-S1 (series 3, image 13). See additional details of that level below. Background bone marrow signal is mildly heterogeneous but within normal limits. No suspicious marrow lesion. Visible sacrum and SI joints appear intact.   Conus medullaris and cauda equina: Conus extends to the T12-L1 level. No lower spinal cord or conus signal abnormality.   Paraspinal and other soft tissues: Partially visible benign appearing renal cysts. Other visualized abdominal viscera and paraspinal soft tissues are within normal limits.   Disc levels:   Negative visible lower thoracic levels through T12-L1.   L1-L2: Left subarticular disc extrusion with a roughly 8 mm disc fragment (series 2, image 11). But only mild left lateral recess and left foraminal involvement. Mild facet hypertrophy. No spinal stenosis.   L2-L3: Circumferential disc bulge. No significant stenosis.   L3-L4: Disc or disc osteophyte complex involving the right lateral recess with moderate to severe stenosis  (descending right L4 nerve level series 5, image 25). Underlying mild disc bulging and facet hypertrophy. Mild overall spinal stenosis. And mild to moderate right L3 foraminal stenosis.   L4-L5: Rightward circumferential disc bulge with superimposed subarticular and foraminal disc extrusion (series 5, image 30). Mild facet and ligament flavum hypertrophy. Moderate right lateral recess stenosis (right L5 nerve level) and severe right foraminal stenosis (right L4 nerve level). No significant spinal stenosis.   L5-S1: Moderate facet hypertrophy greater on the left. Degenerative facet joint fluid and marrow edema. Small posteriorly situated synovial cysts which should not cause neural compromise (series 3, image 12) but there is also a small 5-6 mm synovial cyst projecting into the left L5 neural foramen with moderate to severe stenosis when combined with mild disc bulging and endplate spurring (series 2, image 12). No spinal or lateral recess stenosis.   IMPRESSION: 1. Symptomatic level with regard to right side pain favored to be L4-L5 where a foraminal disc extrusion affects the exiting right L4 and to a lesser extent descending right L5 nerve levels. 2. Moderate to severe right lateral recess stenosis also at L3-L4, right L4 nerve level. 3. Moderate to severe left L5 foraminal stenosis at L5-S1 in part due to a small facet related synovial cyst. 4. Small left subarticular disc extrusion at L1-L2 without convincing neural impingement.     Electronically Signed By: Genevie Ann M.D. On: 04/16/2021 12:31     Objective:  VS:  HT:    WT:   BMI:     BP:117/75  HR:87bpm  TEMP: ( )  RESP:  Physical Exam Vitals and nursing note reviewed.  Constitutional:      General: He is not in acute distress.    Appearance: Normal appearance. He is not ill-appearing.  HENT:     Head: Normocephalic and atraumatic.     Right Ear: External ear normal.     Left Ear: External ear normal.      Nose: No congestion.  Eyes:     Extraocular Movements: Extraocular movements intact.  Cardiovascular:     Rate and Rhythm: Normal rate.     Pulses: Normal pulses.  Pulmonary:     Effort: Pulmonary effort is normal. No respiratory distress.  Abdominal:     General: There is no distension.     Palpations: Abdomen is soft.  Musculoskeletal:        General: No tenderness or signs of injury.     Cervical back: Neck supple.     Right lower leg: No edema.     Left lower leg: No edema.     Comments: Patient has good distal strength without clonus.  Skin:    Findings: No erythema or rash.  Neurological:     General: No focal deficit present.     Mental Status: He is alert and oriented to person, place, and time.     Sensory: No sensory deficit.     Motor: No weakness or abnormal muscle tone.     Coordination: Coordination normal.  Psychiatric:        Mood and Affect: Mood normal.        Behavior: Behavior normal.     Imaging: No results found.

## 2021-08-31 ENCOUNTER — Other Ambulatory Visit: Payer: Self-pay | Admitting: Adult Health

## 2021-08-31 NOTE — Telephone Encounter (Signed)
Pt is due for DM f/u

## 2021-09-10 ENCOUNTER — Telehealth: Payer: Self-pay | Admitting: Physical Medicine and Rehabilitation

## 2021-09-10 NOTE — Telephone Encounter (Signed)
Pt called requesting a call back to set an appt for injection. Please call pt at [phone].

## 2021-09-14 ENCOUNTER — Other Ambulatory Visit: Payer: Self-pay | Admitting: Physical Medicine and Rehabilitation

## 2021-09-14 DIAGNOSIS — M5416 Radiculopathy, lumbar region: Secondary | ICD-10-CM

## 2021-09-16 ENCOUNTER — Other Ambulatory Visit: Payer: Self-pay | Admitting: Adult Health

## 2021-09-28 ENCOUNTER — Ambulatory Visit: Payer: Medicare Other

## 2021-11-15 ENCOUNTER — Ambulatory Visit (INDEPENDENT_AMBULATORY_CARE_PROVIDER_SITE_OTHER): Payer: Medicare Other

## 2021-11-15 VITALS — BP 118/60 | HR 97 | Temp 97.6°F | Ht 68.0 in | Wt 182.1 lb

## 2021-11-15 DIAGNOSIS — Z0000 Encounter for general adult medical examination without abnormal findings: Secondary | ICD-10-CM | POA: Diagnosis not present

## 2021-11-15 NOTE — Progress Notes (Signed)
Subjective:   Jeffrey Spears is a 76 y.o. male who presents for Medicare Annual/Subsequent preventive examination.  Review of Systems     Cardiac Risk Factors include: advanced age (>5men, >44 women);male gender     Objective:    Today's Vitals   11/15/21 0900  BP: 118/60  Pulse: 97  Temp: 97.6 F (36.4 C)  SpO2: 97%  Weight: 182 lb 1.6 oz (82.6 kg)  Height: 5\' 8"  (1.727 m)   Body mass index is 27.69 kg/m.  Advanced Directives 11/15/2021 04/26/2021 03/09/2017 01/16/2017 12/15/2016  Does Patient Have a Medical Advance Directive? Yes Yes No Yes Yes  Type of Paramedic of [place];Living will Living will;Healthcare Power of [place];Living will [place]  Does patient want to make changes to medical advance directive? No - Patient declined No - Patient declined - - -  Copy of [place] in Chart? No - copy requested - - - No - copy requested  Would patient like information on creating a medical advance directive? - No - Patient declined - - -    Current Medications (verified) Outpatient Encounter Medications as of 11/15/2021  Medication Sig   atorvastatin (LIPITOR) 40 MG tablet TAKE 1 TABLET BY MOUTH  DAILY   finasteride (PROSCAR) 5 MG tablet Take by mouth.   fluticasone (FLONASE) 50 MCG/ACT nasal spray USE 2 SPRAYS IN BOTH  NOSTRILS DAILY   lisinopril (ZESTRIL) 10 MG tablet TAKE 1 TABLET BY MOUTH  DAILY   sertraline (ZOLOFT) 100 MG tablet TAKE 2 TABLETS BY MOUTH  DAILY   tamsulosin (FLOMAX) 0.4 MG CAPS capsule TAKE 1 CAPSULE BY MOUTH  TWICE DAILY   [DISCONTINUED] gabapentin (NEURONTIN) 300 MG capsule Take 1-2 capsules (300-600 mg total) by mouth at bedtime as needed for up to 1 dose.   No facility-administered encounter medications on file as of 11/15/2021.    Allergies (verified) Patient has no known allergies.   History: Past Medical History:  Diagnosis Date   Allergy     Anxiety    Colon polyp    Diabetes mellitus ([place])    Hyperlipidemia    Seasonal allergies    Vitamin D deficiency    Past Surgical History:  Procedure Laterality Date   CATARACT EXTRACTION  2014   bilateral    CERVICAL SPINE SURGERY  2003   herniated disk    COLONOSCOPY     HERNIA REPAIR     PARS PLANA VITRECTOMY W/ REPAIR OF MACULAR HOLE  2013   POLYPECTOMY     ROTATOR CUFF REPAIR  2006   left shoulder   VASECTOMY     1982   Family History  Problem Relation Age of Onset   Emphysema Mother    Heart disease Father    Stroke Brother    Alzheimer's disease Brother    Alzheimer's disease Maternal Grandmother    Colon cancer Neg Hx    Esophageal cancer Neg Hx    Rectal cancer Neg Hx    Stomach cancer Neg Hx    Social History   Socioeconomic History   Marital status: Widowed    Spouse name: Not on file   Number of children: Not on file   Years of education: Not on file   Highest education level: Not on file  Occupational History   Not on file  Tobacco Use   Smoking status: Never   Smokeless tobacco: Never  Vaping Use  Vaping Use: Never used  Substance and Sexual Activity   Alcohol use: Yes    Alcohol/week: 0.0 standard drinks    Comment: one drink nightly    Drug use: No   Sexual activity: Yes  Other Topics Concern   Not on file  Social History Narrative   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    Social Determinants of Health   Financial Resource Strain: Low Risk    Difficulty of Paying Living Expenses: Not hard at all  Food Insecurity: No Food Insecurity   Worried About Charity fundraiser in the Last Year: Never true   [place] in the Last Year: Never true  Transportation Needs: No Transportation Needs   Lack of Transportation (Medical): No   Lack of Transportation (Non-Medical): No  Physical Activity: Insufficiently Active   Days of Exercise per Week: 5 days   Minutes  of Exercise per Session: 20 min  Stress: No Stress Concern Present   Feeling of Stress : Only a little  Social Connections: Socially Isolated   Frequency of Communication with Friends and Family: More than three times a week   Frequency of Social Gatherings with Friends and Family: More than three times a week   Attends Religious Services: Never   Marine scientist or Organizations: No   Attends Archivist Meetings: Never   Marital Status: Widowed    Clinical Intake:  Pre-visit preparation completed: Yes  Pain : No/denies pain     BMI - recorded: 28.74 Nutritional Status: BMI 25 -29 Overweight Nutritional Risks: None Diabetes: No  How often do you need to have someone help you when you read instructions, pamphlets, or other written materials from your doctor or pharmacy?: 1 - Never  Diabetic? No  Interpreter Needed?: No  Information entered by :: Rolene Arbour LPN   Activities of Daily Living In your present state of health, do you have any difficulty performing the following activities: 11/15/2021  Hearing? N  Vision? N  Difficulty concentrating or making decisions? N  Walking or climbing stairs? N  Dressing or bathing? N  Doing errands, shopping? N  Preparing Food and eating ? N  Using the Toilet? N  In the past six months, have you accidently leaked urine? N  Do you have problems with loss of bowel control? N  Managing your Medications? N  Managing your Finances? N  Housekeeping or managing your Housekeeping? N  Some recent data might be hidden    Patient Care Team: Dorothyann Peng, NP as PCP - General (Family Medicine)  Indicate any recent Medical Services you may have received from other than [facility] providers in the past year (date may be approximate).     Assessment:   This is a routine wellness examination for Jeffrey Spears.  Hearing/Vision screen Hearing Screening - Comments:: Wears hearing aids Vision Screening - Comments:: Wears glasses.  Followed by Dr Nicki Reaper  Dietary issues and exercise activities discussed: Current Exercise Habits: Home exercise routine, Type of exercise: walking, Time (Minutes): 20, Frequency (Times/Week): 5, Weekly Exercise (Minutes/Week): 100, Intensity: Moderate, Exercise limited by: None identified   Goals Addressed               This Visit's Progress     Increase physical activity (pt-stated)        Increase walking exercise       Depression  Screen PHQ 2/9 Scores 11/15/2021 03/17/2020 03/17/2020 12/22/2017 12/15/2016 12/15/2015 09/08/2015  PHQ - 2 Score 1 0 0 0 0 0 0  PHQ- 9 Score - - - - 0 - -    Fall Risk Fall Risk  11/15/2021 03/17/2020 03/17/2020 05/02/2019 12/22/2017  Falls in the past year? 0 0 0 (No Data) No  Comment - - - Emmi Telephone Survey: data to providers prior to load -  Number falls in past yr: 0 - - (No Data) -  Comment - - - Emmi Telephone Survey Actual Response =  -  Injury with Fall? 0 - - - -  Risk for fall due to : No Fall Risks - - - -    FALL RISK PREVENTION PERTAINING TO THE HOME:  Any stairs in or around the home? No  If so, are there any without handrails? No  Home free of loose throw rugs in walkways, pet beds, electrical cords, etc? Yes  Adequate lighting in your home to reduce risk of falls? Yes   ASSISTIVE DEVICES UTILIZED TO PREVENT FALLS:  Life alert? No  Use of a cane, walker or w/c? No  Grab bars in the bathroom? Yes  Shower chair or bench in shower? Yes  Elevated toilet seat or a handicapped toilet? No   TIMED UP AND GO:  Was the test performed? Yes .  Length of time to ambulate 10 feet: 5 sec.   Gait steady and fast without use of assistive device  Cognitive Function:     6CIT Screen 11/15/2021  What Year? 0 points  What month? 0 points  What time? 0 points  Count back from 20 0 points  Months in reverse 0 points  Repeat phrase 0 points  Total Score 0    Immunizations Immunization History  Administered Date(s) Administered    Influenza, High Dose Seasonal PF 09/16/2015   Influenza-Unspecified 07/27/2010, 10/11/2011, 07/19/2013, 06/19/2014   PFIZER(Purple Top)SARS-COV-2 Vaccination 10/24/2019, 11/13/2019, 05/12/2020   Pneumococcal Conjugate-13 07/02/2014   Pneumococcal Polysaccharide-23 10/11/2011   Tdap 08/12/2013    TDAP status: Up to date  Flu Vaccine status: Due, Education has been provided regarding the importance of this vaccine. Advised may receive this vaccine at local pharmacy or Health Dept. Aware to provide a copy of the vaccination record if obtained from local pharmacy or Health Dept. Verbalized acceptance and understanding.  Pneumococcal vaccine status: Up to date  Covid-19 vaccine status: Completed vaccines  Qualifies for Shingles Vaccine? Yes   Zostavax completed No   Shingrix Completed?: No.    Education has been provided regarding the importance of this vaccine. Patient has been advised to call insurance company to determine out of pocket expense if they have not yet received this vaccine. Advised may also receive vaccine at local pharmacy or Health Dept. Verbalized acceptance and understanding.  Screening Tests Health Maintenance  Topic Date Due   COVID-19 Vaccine (4 - Booster for Pfizer series) 07/07/2020   HEMOGLOBIN A1C  06/18/2021   OPHTHALMOLOGY EXAM  10/06/2021   INFLUENZA VACCINE  12/31/2021 (Originally 05/03/2021)   Zoster Vaccines- Shingrix (1 of 2) 02/12/2022 (Originally 12/10/1995)   FOOT EXAM  12/16/2021   COLONOSCOPY (Pts 45-93yrs Insurance coverage will need to be confirmed)  03/09/2022   TETANUS/TDAP  08/13/2023   Pneumonia Vaccine 12+ Years old  Completed   Hepatitis C Screening  Completed   HPV VACCINES  Aged Out    Health Maintenance  Health Maintenance Due  Topic Date Due   COVID-19  Vaccine (4 - Booster for Pfizer series) 07/07/2020   HEMOGLOBIN A1C  06/18/2021   OPHTHALMOLOGY EXAM  10/06/2021    Colorectal cancer screening: Type of screening: Colonoscopy.  Completed 03/09/17. Repeat every 5 years  Lung Cancer Screening: (Low Dose CT Chest recommended if Age 2-80 years, 30 pack-year currently smoking OR have quit w/in 15years.) does qualify.    Additional Screening:  Hepatitis C Screening: does qualify; Completed 12/15/15  Vision Screening: Recommended annual ophthalmology exams for early detection of glaucoma and other disorders of the eye. Is the patient up to date with their annual eye exam?  Yes  Who is the provider or what is the name of the office in which the patient attends annual eye exams? Dr Nicki Reaper If pt is not established with a provider, would they like to be referred to a provider to establish care? No .   Dental Screening: Recommended annual dental exams for proper oral hygiene  Community Resource Referral / Chronic Care Management:  CRR required this visit?  No   CCM required this visit?  No      Plan:     I have personally reviewed and noted the following in the patients chart:   Medical and social history Use of alcohol, tobacco or illicit drugs  Current medications and supplements including opioid prescriptions. Patient is not currently taking opioid prescriptions. Functional ability and status Nutritional status Physical activity Advanced directives List of other physicians Hospitalizations, surgeries, and ER visits in previous 12 months Vitals Screenings to include cognitive, depression, and falls Referrals and appointments  In addition, I have reviewed and discussed with patient certain preventive protocols, quality metrics, and best practice recommendations. A written personalized care plan for preventive services as well as general preventive health recommendations were provided to patient.     Criselda Peaches, LPN   [id number]/77/[id number]   Nurse Notes: None

## 2021-11-15 NOTE — Patient Instructions (Signed)
[name] , Thank you for taking time to come for your Medicare Wellness Visit. I appreciate your ongoing commitment to your health goals. Please review the following plan we discussed and let me know if I can assist you in the future.   These are the goals we discussed:  Goals       Increase physical activity (pt-stated)      Increase walking exercise        This is a list of the screening recommended for you and due dates:  Health Maintenance  Topic Date Due   COVID-19 Vaccine (4 - Booster for Pfizer series) 07/07/2020   Hemoglobin A1C  06/18/2021   Eye exam for diabetics  10/06/2021   Flu Shot  12/31/2021*   Zoster (Shingles) Vaccine (1 of 2) 02/12/2022*   Complete foot exam   12/16/2021   Colon Cancer Screening  03/09/2022   Tetanus Vaccine  08/13/2023   Pneumonia Vaccine  Completed   Hepatitis C Screening: USPSTF Recommendation to screen - Ages 18-79 yo.  Completed   HPV Vaccine  Aged Out  *Topic was postponed. The date shown is not the original due date.    Advanced directives: Yes Patient will bring copy  Conditions/risks identified: None  Next appointment: Follow up in one year for your annual wellness visit.   Preventive Care 35 Years and Older, Male Preventive care refers to lifestyle choices and visits with your health care provider that can promote health and wellness. What does preventive care include? A yearly physical exam. This is also called an annual well check. Dental exams once or twice a year. Routine eye exams. Ask your health care provider how often you should have your eyes checked. Personal lifestyle choices, including: Daily care of your teeth and gums. Regular physical activity. Eating a healthy diet. Avoiding tobacco and drug use. Limiting alcohol use. Practicing safe sex. Taking low doses of aspirin every day. Taking vitamin and mineral supplements as recommended by your health care provider. What happens during an annual well  check? The services and screenings done by your health care provider during your annual well check will depend on your age, overall health, lifestyle risk factors, and family history of disease. Counseling  Your health care provider may ask you questions about your: Alcohol use. Tobacco use. Drug use. Emotional well-being. Home and relationship well-being. Sexual activity. Eating habits. History of falls. Memory and ability to understand (cognition). Work and work Statistician. Screening  You may have the following tests or measurements: Height, weight, and BMI. Blood pressure. Lipid and cholesterol levels. These may be checked every 5 years, or more frequently if you are over 53 years old. Skin check. Lung cancer screening. You may have this screening every year starting at age 30 if you have a 30-pack-year history of smoking and currently smoke or have quit within the past 15 years. Fecal occult blood test (FOBT) of the stool. You may have this test every year starting at age 83. Flexible sigmoidoscopy or colonoscopy. You may have a sigmoidoscopy every 5 years or a colonoscopy every 10 years starting at age 27. Prostate cancer screening. Recommendations will vary depending on your family history and other risks. Hepatitis C blood test. Hepatitis B blood test. Sexually transmitted disease (STD) testing. Diabetes screening. This is done by checking your blood sugar (glucose) after you have not eaten for a while (fasting). You may have this done every 1-3 years. Abdominal aortic aneurysm (AAA) screening. You may need this if  you are a current or former smoker. Osteoporosis. You may be screened starting at age 80 if you are at high risk. Talk with your health care provider about your test results, treatment options, and if necessary, the need for more tests. Vaccines  Your health care provider may recommend certain vaccines, such as: Influenza vaccine. This is recommended every  year. Tetanus, diphtheria, and acellular pertussis (Tdap, Td) vaccine. You may need a Td booster every 10 years. Zoster vaccine. You may need this after age 50. Pneumococcal 13-valent conjugate (PCV13) vaccine. One dose is recommended after age 34. Pneumococcal polysaccharide (PPSV23) vaccine. One dose is recommended after age 13. Talk to your health care provider about which screenings and vaccines you need and how often you need them. This information is not intended to replace advice given to you by your health care provider. Make sure you discuss any questions you have with your health care provider. Document Released: 10/16/2015 Document Revised: 06/08/2016 Document Reviewed: 07/21/2015 Elsevier Interactive Patient Education  2017 [place] Prevention in the Home Falls can cause injuries. They can happen to people of all ages. There are many things you can do to make your home safe and to help prevent falls. What can I do on the outside of my home? Regularly fix the edges of walkways and driveways and fix any cracks. Remove anything that might make you trip as you walk through a door, such as a raised step or threshold. Trim any bushes or trees on the path to your home. Use bright outdoor lighting. Clear any walking paths of anything that might make someone trip, such as rocks or tools. Regularly check to see if handrails are loose or broken. Make sure that both sides of any steps have handrails. Any raised decks and porches should have guardrails on the edges. Have any leaves, snow, or ice cleared regularly. Use sand or salt on walking paths during winter. Clean up any spills in your garage right away. This includes oil or grease spills. What can I do in the bathroom? Use night lights. Install grab bars by the toilet and in the tub and shower. Do not use towel bars as grab bars. Use non-skid mats or decals in the tub or shower. If you need to sit down in the shower, use a  plastic, non-slip stool. Keep the floor dry. Clean up any water that spills on the floor as soon as it happens. Remove soap buildup in the tub or shower regularly. Attach bath mats securely with double-sided non-slip rug tape. Do not have throw rugs and other things on the floor that can make you trip. What can I do in the bedroom? Use night lights. Make sure that you have a light by your bed that is easy to reach. Do not use any sheets or blankets that are too big for your bed. They should not hang down onto the floor. Have a firm chair that has side arms. You can use this for support while you get dressed. Do not have throw rugs and other things on the floor that can make you trip. What can I do in the kitchen? Clean up any spills right away. Avoid walking on wet floors. Keep items that you use a lot in easy-to-reach places. If you need to reach something above you, use a strong step stool that has a grab bar. Keep electrical cords out of the way. Do not use floor polish or wax that makes floors slippery. If  you must use wax, use non-skid floor wax. Do not have throw rugs and other things on the floor that can make you trip. What can I do with my stairs? Do not leave any items on the stairs. Make sure that there are handrails on both sides of the stairs and use them. Fix handrails that are broken or loose. Make sure that handrails are as long as the stairways. Check any carpeting to make sure that it is firmly attached to the stairs. Fix any carpet that is loose or worn. Avoid having throw rugs at the top or bottom of the stairs. If you do have throw rugs, attach them to the floor with carpet tape. Make sure that you have a light switch at the top of the stairs and the bottom of the stairs. If you do not have them, ask someone to add them for you. What else can I do to help prevent falls? Wear shoes that: Do not have high heels. Have rubber bottoms. Are comfortable and fit you  well. Are closed at the toe. Do not wear sandals. If you use a stepladder: Make sure that it is fully opened. Do not climb a closed stepladder. Make sure that both sides of the stepladder are locked into place. Ask someone to hold it for you, if possible. Clearly mark and make sure that you can see: Any grab bars or handrails. First and last steps. Where the edge of each step is. Use tools that help you move around (mobility aids) if they are needed. These include: Canes. Walkers. Scooters. Crutches. Turn on the lights when you go into a dark area. Replace any light bulbs as soon as they burn out. Set up your furniture so you have a clear path. Avoid moving your furniture around. If any of your floors are uneven, fix them. If there are any pets around you, be aware of where they are. Review your medicines with your doctor. Some medicines can make you feel dizzy. This can increase your chance of falling. Ask your doctor what other things that you can do to help prevent falls. This information is not intended to replace advice given to you by your health care provider. Make sure you discuss any questions you have with your health care provider. Document Released: 07/16/2009 Document Revised: 02/25/2016 Document Reviewed: 10/24/2014 Elsevier Interactive Patient Education  2017 [employer].

## 2021-12-17 ENCOUNTER — Encounter: Payer: Self-pay | Admitting: Adult Health

## 2021-12-17 ENCOUNTER — Ambulatory Visit (INDEPENDENT_AMBULATORY_CARE_PROVIDER_SITE_OTHER): Payer: Medicare Other | Admitting: Adult Health

## 2021-12-17 VITALS — BP 120/82 | HR 65 | Temp 98.5°F | Ht 67.5 in | Wt 179.0 lb

## 2021-12-17 DIAGNOSIS — N401 Enlarged prostate with lower urinary tract symptoms: Secondary | ICD-10-CM | POA: Diagnosis not present

## 2021-12-17 DIAGNOSIS — N138 Other obstructive and reflux uropathy: Secondary | ICD-10-CM

## 2021-12-17 DIAGNOSIS — E785 Hyperlipidemia, unspecified: Secondary | ICD-10-CM

## 2021-12-17 DIAGNOSIS — E119 Type 2 diabetes mellitus without complications: Secondary | ICD-10-CM

## 2021-12-17 DIAGNOSIS — M5416 Radiculopathy, lumbar region: Secondary | ICD-10-CM

## 2021-12-17 DIAGNOSIS — F411 Generalized anxiety disorder: Secondary | ICD-10-CM

## 2021-12-17 DIAGNOSIS — I10 Essential (primary) hypertension: Secondary | ICD-10-CM

## 2021-12-17 DIAGNOSIS — R972 Elevated prostate specific antigen [PSA]: Secondary | ICD-10-CM | POA: Diagnosis not present

## 2021-12-17 LAB — COMPREHENSIVE METABOLIC PANEL
ALT: 16 U/L (ref 0–53)
AST: 18 U/L (ref 0–37)
Albumin: 4.3 g/dL (ref 3.5–5.2)
Alkaline Phosphatase: 63 U/L (ref 39–117)
BUN: 19 mg/dL (ref 6–23)
CO2: 30 mEq/L (ref 19–32)
Calcium: 9 mg/dL (ref 8.4–10.5)
Chloride: 103 mEq/L (ref 96–112)
Creatinine, Ser: 1.1 mg/dL (ref 0.40–1.50)
GFR: 65.43 mL/min (ref >60.00)
Glucose, Bld: 118 mg/dL — ABNORMAL HIGH (ref 70–99)
Potassium: 4.4 mEq/L (ref 3.5–5.1)
Sodium: 140 mEq/L (ref 135–145)
Total Bilirubin: 0.5 mg/dL (ref 0.2–1.2)
Total Protein: 7.1 g/dL (ref 6.0–8.3)

## 2021-12-17 LAB — TSH: TSH: 2.26 u[IU]/mL (ref 0.35–5.50)

## 2021-12-17 LAB — CBC WITH DIFFERENTIAL/PLATELET
Basophils Absolute: 0 10*3/uL (ref 0.0–0.1)
Basophils Relative: 0.4 % (ref 0.0–3.0)
Eosinophils Absolute: 0.2 10*3/uL (ref 0.0–0.7)
Eosinophils Relative: 4.3 % (ref 0.0–5.0)
HCT: 40.8 % (ref 39.0–52.0)
Hemoglobin: 13.5 g/dL (ref 13.0–17.0)
Lymphocytes Relative: 24.1 % (ref 12.0–46.0)
Lymphs Abs: 1 10*3/uL (ref 0.7–4.0)
MCHC: 33.1 g/dL (ref 30.0–36.0)
MCV: 90.9 fl (ref 78.0–100.0)
Monocytes Absolute: 0.4 10*3/uL (ref 0.1–1.0)
Monocytes Relative: 8.3 % (ref 3.0–12.0)
Neutro Abs: 2.7 10*3/uL (ref 1.4–7.7)
Neutrophils Relative %: 62.9 % (ref 43.0–77.0)
Platelets: 227 10*3/uL (ref 150.0–400.0)
RBC: 4.48 Mil/uL (ref 4.22–5.81)
RDW: 14 % (ref 11.5–15.5)
WBC: 4.3 10*3/uL (ref 4.0–10.5)

## 2021-12-17 LAB — HEMOGLOBIN A1C: Hgb A1c MFr Bld: 6.6 % — ABNORMAL HIGH (ref 4.6–6.5)

## 2021-12-17 LAB — LIPID PANEL
Cholesterol: 149 mg/dL (ref 0–200)
HDL: 53.4 mg/dL (ref >39.00)
LDL Cholesterol: 78 mg/dL (ref 0–99)
NonHDL: 95.53
Total CHOL/HDL Ratio: 3
Triglycerides: 88 mg/dL (ref 0.0–149.0)
VLDL: 17.6 mg/dL (ref 0.0–40.0)

## 2021-12-17 LAB — PSA: PSA: 11.44 ng/mL — ABNORMAL HIGH (ref 0.10–4.00)

## 2021-12-17 NOTE — Progress Notes (Signed)
? ?Subjective:  ? ? Patient ID: Jeffrey Spears, male    DOB: [birth year], 76 y.o.   MRN: [id number] ? ?HPI ? ?Patient presents for yearly preventative medicine examination. He is a pleasant 76 year old male who  has a past medical history of Allergy, Anxiety, Colon polyp, Diabetes mellitus ([place]), Hyperlipidemia, Seasonal allergies, and Vitamin D deficiency. ? ?DM -diet controlled.  He stays very active and eats healthy ?Lab Results  ?Component Value Date  ? HGBA1C 6.5 12/16/2020  ? ?Hypertension-managed with lisinopril 10 mg daily.  He denies dizziness, lightheadedness, chest pain, or shortness of breath ?BP Readings from Last 3 Encounters:  ?12/17/21 120/82  ?11/15/21 118/60  ?08/24/21 117/75  ? ?Hyperlipidemia -managed with Lipitor 40 mg daily.  He denies myalgia or fatigue ?Lab Results  ?Component Value Date  ? CHOL 156 12/16/2020  ? HDL 54.20 12/16/2020  ? Cable 83 12/16/2020  ? TRIG 97.0 12/16/2020  ? CHOLHDL 3 12/16/2020  ? ?Anxiety-  take Zoloft 200 mg daily.  Feels well controlled on this dose ? ?BPH/Elevated PSA -longstanding history of elevated PSA numbers.  He has had a prostate biopsy in the past which was negative.  Symptoms are managed with Proscar 5 mg daily and Flomax 0.4 mg daily.  ? ?Lumbar Radiculopathy -he has received 2 steroid epidural injections over the last 6 months.  Each has lasted roughly 3 months.  He would like to be referred to neurosurgery to discuss possible surgical intervention. MRI from July 2022 showed  ? ?IMPRESSION: ?1. Symptomatic level with regard to right side pain favored to be ?L4-L5 where a foraminal disc extrusion affects the exiting right L4 ?and to a lesser extent descending right L5 nerve levels. ?2. Moderate to severe right lateral recess stenosis also at L3-L4, ?right L4 nerve level. ?3. Moderate to severe left L5 foraminal stenosis at L5-S1 in part ?due to a small facet related synovial cyst. ?4. Small left subarticular disc extrusion at L1-L2 without ?convincing  neural impingement. ? ?All immunizations and health maintenance protocols were reviewed with the patient and needed orders were placed. ? ?Appropriate screening laboratory values were ordered for the patient including screening of hyperlipidemia, renal function and hepatic function. ?If indicated by BPH, a PSA was ordered. ? ?Medication reconciliation,  past medical history, social history, problem list and allergies were reviewed in detail with the patient ? ?Goals were established with regard to weight loss, exercise, and  diet in compliance with medications ? ?Wt Readings from Last 10 Encounters:  ?12/17/21 179 lb (81.2 kg)  ?11/15/21 182 lb 1.6 oz (82.6 kg)  ?03/30/21 189 lb (85.7 kg)  ?12/16/20 188 lb (85.3 kg)  ?09/24/20 190 lb 12.8 oz (86.5 kg)  ?03/17/20 192 lb (87.1 kg)  ?10/25/18 191 lb 2 oz (86.7 kg)  ?12/22/17 188 lb (85.3 kg)  ?03/09/17 197 lb (89.4 kg)  ?01/16/17 197 lb 6.4 oz (89.5 kg)  ? ?Review of Systems  ?Constitutional: Negative.   ?HENT: Negative.    ?Eyes: Negative.   ?Respiratory: Negative.    ?Cardiovascular: Negative.   ?Gastrointestinal: Negative.   ?Endocrine: Negative.   ?Genitourinary: Negative.   ?Musculoskeletal: Negative.   ?Skin: Negative.   ?Allergic/Immunologic: Negative.   ?Neurological: Negative.   ?Hematological: Negative.   ?Psychiatric/Behavioral: Negative.    ?All other systems reviewed and are negative. ? ?Past Medical History:  ?Diagnosis Date  ? Allergy   ? Anxiety   ? Colon polyp   ? Diabetes mellitus ([place])   ? Hyperlipidemia   ?  Seasonal allergies   ? Vitamin D deficiency   ? ? ?Social History  ? ?Socioeconomic History  ? Marital status: Widowed  ?  Spouse name: Not on file  ? Number of children: Not on file  ? Years of education: Not on file  ? Highest education level: Not on file  ?Occupational History  ? Not on file  ?Tobacco Use  ? Smoking status: Never  ? Smokeless tobacco: Never  ?Vaping Use  ? Vaping Use: Never used  ?Substance and Sexual Activity  ? Alcohol use:  Yes  ?  Alcohol/week: 0.0 standard drinks  ?  Comment: one drink nightly   ? Drug use: No  ? Sexual activity: Yes  ?Other Topics Concern  ? Not on file  ?Social History Narrative  ? Retired from Licensed conveyancer in [place]   ? Married   ? Moved to [place] in May to be closer to family.   ? ?Social Determinants of Health  ? ?Financial Resource Strain: Low Risk   ? Difficulty of Paying Living Expenses: Not hard at all  ?Food Insecurity: No Food Insecurity  ? Worried About Charity fundraiser in the Last Year: Never true  ? Ran Out of Food in the Last Year: Never true  ?Transportation Needs: No Transportation Needs  ? Lack of Transportation (Medical): No  ? Lack of Transportation (Non-Medical): No  ?Physical Activity: Insufficiently Active  ? Days of Exercise per Week: 5 days  ? Minutes of Exercise per Session: 20 min  ?Stress: No Stress Concern Present  ? Feeling of Stress : Only a little  ?Social Connections: Socially Isolated  ? Frequency of Communication with Friends and Family: More than three times a week  ? Frequency of Social Gatherings with Friends and Family: More than three times a week  ? Attends Religious Services: Never  ? Active Member of Clubs or Organizations: No  ? Attends Archivist Meetings: Never  ? Marital Status: Widowed  ?Intimate Partner Violence: Not At Risk  ? Fear of Current or Ex-Partner: No  ? Emotionally Abused: No  ? Physically Abused: No  ? Sexually Abused: No  ? ? ?Past Surgical History:  ?Procedure Laterality Date  ? CATARACT EXTRACTION  2014  ? bilateral   ? CERVICAL SPINE SURGERY  2003  ? herniated disk   ? COLONOSCOPY    ? HERNIA REPAIR    ? PARS PLANA VITRECTOMY W/ REPAIR OF MACULAR HOLE  2013  ? POLYPECTOMY    ? ROTATOR CUFF REPAIR  2006  ? left shoulder  ? VASECTOMY    ? 1982  ? ? ?Family History  ?Problem Relation Age of Onset  ? Emphysema Mother   ? Heart disease Father   ? Stroke Brother   ? Alzheimer's disease Brother   ? Alzheimer's disease Maternal  Grandmother   ? Colon cancer Neg Hx   ? Esophageal cancer Neg Hx   ? Rectal cancer Neg Hx   ? Stomach cancer Neg Hx   ? ? ?No Known Allergies ? ?Current Outpatient Medications on File Prior to Visit  ?Medication Sig Dispense Refill  ? atorvastatin (LIPITOR) 40 MG tablet TAKE 1 TABLET BY MOUTH  DAILY 30 tablet 11  ? finasteride (PROSCAR) 5 MG tablet Take by mouth.    ? fluticasone (FLONASE) 50 MCG/ACT nasal spray USE 2 SPRAYS IN BOTH  NOSTRILS DAILY 48 g 0  ? lisinopril (ZESTRIL) 10 MG tablet TAKE 1 TABLET BY MOUTH  DAILY  30 tablet 11  ? sertraline (ZOLOFT) 100 MG tablet TAKE 2 TABLETS BY MOUTH  DAILY 60 tablet 11  ? tamsulosin (FLOMAX) 0.4 MG CAPS capsule TAKE 1 CAPSULE BY MOUTH  TWICE DAILY 180 capsule 3  ? ?No current facility-administered medications on file prior to visit.  ? ? ?BP 120/82   Pulse 65   Temp 98.5 ?F (36.9 ?C) (Oral)   Ht 5' 7.5" (1.715 m)   Wt 179 lb (81.2 kg)   SpO2 95%   BMI 27.62 kg/m?  ? ? ? ?   ?Objective:  ? Physical Exam ?Vitals and nursing note reviewed.  ?Constitutional:   ?   General: He is not in acute distress. ?   Appearance: Normal appearance. He is well-developed and normal weight.  ?HENT:  ?   Head: Normocephalic and atraumatic.  ?   Right Ear: Tympanic membrane, ear canal and external ear normal. There is no impacted cerumen.  ?   Left Ear: Tympanic membrane, ear canal and external ear normal. There is no impacted cerumen.  ?   Nose: Nose normal. No congestion or rhinorrhea.  ?   Mouth/Throat:  ?   Mouth: Mucous membranes are moist.  ?   Pharynx: Oropharynx is clear. No oropharyngeal exudate or posterior oropharyngeal erythema.  ?Eyes:  ?   General:     ?   Right eye: No discharge.     ?   Left eye: No discharge.  ?   Extraocular Movements: Extraocular movements intact.  ?   Conjunctiva/sclera: Conjunctivae normal.  ?   Pupils: Pupils are equal, round, and reactive to light.  ?Neck:  ?   Vascular: No carotid bruit.  ?   Trachea: No tracheal deviation.  ?Cardiovascular:  ?    Rate and Rhythm: Normal rate and regular rhythm.  ?   Pulses: Normal pulses.  ?   Heart sounds: Normal heart sounds. No murmur heard. ?  No friction rub. No gallop.  ?Pulmonary:  ?   Effort: Pulmonary effort is norma

## 2021-12-17 NOTE — Patient Instructions (Signed)
It was great seeing you today   We will follow up with you regarding your lab work   Please let me know if you need anything   

## 2022-01-13 DIAGNOSIS — H31002 Unspecified chorioretinal scars, left eye: Secondary | ICD-10-CM | POA: Diagnosis not present

## 2022-03-31 ENCOUNTER — Encounter: Payer: Self-pay | Admitting: Gastroenterology

## 2022-05-09 ENCOUNTER — Other Ambulatory Visit: Payer: Self-pay | Admitting: Adult Health

## 2022-05-09 DIAGNOSIS — F411 Generalized anxiety disorder: Secondary | ICD-10-CM

## 2022-05-22 ENCOUNTER — Other Ambulatory Visit: Payer: Self-pay | Admitting: Adult Health

## 2022-06-09 ENCOUNTER — Other Ambulatory Visit: Payer: Self-pay | Admitting: Adult Health

## 2022-07-17 ENCOUNTER — Other Ambulatory Visit: Payer: Self-pay | Admitting: Adult Health

## 2022-07-26 DIAGNOSIS — Z23 Encounter for immunization: Secondary | ICD-10-CM | POA: Diagnosis not present

## 2022-09-04 ENCOUNTER — Other Ambulatory Visit: Payer: Self-pay | Admitting: Adult Health

## 2022-10-17 ENCOUNTER — Encounter: Payer: Self-pay | Admitting: Adult Health

## 2022-10-17 DIAGNOSIS — N138 Other obstructive and reflux uropathy: Secondary | ICD-10-CM

## 2022-10-18 MED ORDER — TAMSULOSIN HCL 0.4 MG PO CAPS: 0.4000 mg | ORAL_CAPSULE | Freq: Two times a day (BID) | ORAL | 3 refills | Status: DC

## 2022-12-06 ENCOUNTER — Telehealth: Payer: Self-pay | Admitting: Adult Health

## 2022-12-06 NOTE — Telephone Encounter (Signed)
Contacted Jeffrey Spears to schedule their annual wellness visit. Patient declined to schedule AWV at this time.  Jeffrey Spears AWV direct phone # [phone]   Spoke with patient to reschedule his awv.  He stated he would talk to cory when he comes in 12/20/22

## 2022-12-20 ENCOUNTER — Ambulatory Visit (INDEPENDENT_AMBULATORY_CARE_PROVIDER_SITE_OTHER): Payer: Medicare Other | Admitting: Adult Health

## 2022-12-20 ENCOUNTER — Encounter: Payer: Self-pay | Admitting: Adult Health

## 2022-12-20 VITALS — BP 130/80 | HR 64 | Temp 97.5°F | Ht 67.0 in | Wt 176.0 lb

## 2022-12-20 DIAGNOSIS — M5416 Radiculopathy, lumbar region: Secondary | ICD-10-CM | POA: Diagnosis not present

## 2022-12-20 DIAGNOSIS — E785 Hyperlipidemia, unspecified: Secondary | ICD-10-CM

## 2022-12-20 DIAGNOSIS — E119 Type 2 diabetes mellitus without complications: Secondary | ICD-10-CM

## 2022-12-20 DIAGNOSIS — F411 Generalized anxiety disorder: Secondary | ICD-10-CM | POA: Diagnosis not present

## 2022-12-20 DIAGNOSIS — I10 Essential (primary) hypertension: Secondary | ICD-10-CM

## 2022-12-20 DIAGNOSIS — N138 Other obstructive and reflux uropathy: Secondary | ICD-10-CM | POA: Diagnosis not present

## 2022-12-20 DIAGNOSIS — N401 Enlarged prostate with lower urinary tract symptoms: Secondary | ICD-10-CM

## 2022-12-20 LAB — CBC
HCT: 41.8 % (ref 39.0–52.0)
Hemoglobin: 13.9 g/dL (ref 13.0–17.0)
MCHC: 33.3 g/dL (ref 30.0–36.0)
MCV: 93 fl (ref 78.0–100.0)
Platelets: 225 10*3/uL (ref 150.0–400.0)
RBC: 4.49 Mil/uL (ref 4.22–5.81)
RDW: 13.4 % (ref 11.5–15.5)
WBC: 4.2 10*3/uL (ref 4.0–10.5)

## 2022-12-20 LAB — COMPREHENSIVE METABOLIC PANEL
ALT: 17 U/L (ref 0–53)
AST: 19 U/L (ref 0–37)
Albumin: 4 g/dL (ref 3.5–5.2)
Alkaline Phosphatase: 57 U/L (ref 39–117)
BUN: 19 mg/dL (ref 6–23)
CO2: 29 mEq/L (ref 19–32)
Calcium: 8.8 mg/dL (ref 8.4–10.5)
Chloride: 103 mEq/L (ref 96–112)
Creatinine, Ser: 1.06 mg/dL (ref 0.40–1.50)
GFR: 67.92 mL/min (ref >60.00)
Glucose, Bld: 108 mg/dL — ABNORMAL HIGH (ref 70–99)
Potassium: 4.5 mEq/L (ref 3.5–5.1)
Sodium: 140 mEq/L (ref 135–145)
Total Bilirubin: 0.5 mg/dL (ref 0.2–1.2)
Total Protein: 7 g/dL (ref 6.0–8.3)

## 2022-12-20 LAB — MICROALBUMIN / CREATININE URINE RATIO
Creatinine,U: 151.4 mg/dL
Microalb Creat Ratio: 0.6 mg/g (ref 0.0–30.0)
Microalb, Ur: 0.9 mg/dL (ref 0.0–1.9)

## 2022-12-20 LAB — HEMOGLOBIN A1C: Hgb A1c MFr Bld: 6.5 % (ref 4.6–6.5)

## 2022-12-20 LAB — PSA: PSA: 15.08 ng/mL — ABNORMAL HIGH (ref 0.10–4.00)

## 2022-12-20 LAB — TSH: TSH: 2.1 u[IU]/mL (ref 0.35–5.50)

## 2022-12-20 LAB — LIPID PANEL
Cholesterol: 149 mg/dL (ref 0–200)
HDL: 56 mg/dL (ref >39.00)
LDL Cholesterol: 78 mg/dL (ref 0–99)
NonHDL: 93.33
Total CHOL/HDL Ratio: 3
Triglycerides: 79 mg/dL (ref 0.0–149.0)
VLDL: 15.8 mg/dL (ref 0.0–40.0)

## 2022-12-20 MED ORDER — SERTRALINE HCL 100 MG PO TABS: 200.0000 mg | ORAL_TABLET | Freq: Every day | ORAL | 1 refills | Status: DC

## 2022-12-20 NOTE — Patient Instructions (Addendum)
It was great seeing you today   We will follow up with you regarding your lab work   Please let me know if you need anything   

## 2022-12-20 NOTE — Progress Notes (Signed)
Subjective:    Patient ID: Jeffrey Spears, male    DOB: August 01, 1946, 77 y.o.   MRN: [id number]  HPI Patient presents for yearly preventative medicine examination. He is a pleasant 77 year old male who  has a past medical history of Allergy, Anxiety, Colon polyp, Diabetes mellitus ([place]), Hyperlipidemia, Seasonal allergies, and Vitamin D deficiency.  DM -diet controlled.  He stays very active and eats healthy Lab Results  Component Value Date   HGBA1C 6.6 (H) 12/17/2021   Hypertension-managed with lisinopril 10 mg daily.  He denies dizziness, lightheadedness, chest pain, or shortness of breath BP Readings from Last 3 Encounters:  12/20/22 130/80  12/17/21 120/82  11/15/21 118/60   Hyperlipidemia -managed with Lipitor 40 mg daily.  He denies myalgia or fatigue Lab Results  Component Value Date   CHOL 149 12/17/2021   HDL 53.40 12/17/2021   LDLCALC 78 12/17/2021   TRIG 88.0 12/17/2021   CHOLHDL 3 12/17/2021   Anxiety-  take Zoloft 200 mg daily.  Feels well controlled on this dose  BPH/Elevated PSA -longstanding history of elevated PSA numbers.  He has had a prostate biopsy in the past which was negative.  Symptoms are managed with Flomax 0.4 mg daily.   Lumbar Radiculopathy - has periodic injections in his lumbar spine. Does not have any pain currently.  IMPRESSION: 1. Symptomatic level with regard to right side pain favored to be L4-L5 where a foraminal disc extrusion affects the exiting right L4 and to a lesser extent descending right L5 nerve levels. 2. Moderate to severe right lateral recess stenosis also at L3-L4, right L4 nerve level. 3. Moderate to severe left L5 foraminal stenosis at L5-S1 in part due to a small facet related synovial cyst. 4. Small left subarticular disc extrusion at L1-L2 without convincing neural impingement.  All immunizations and health maintenance protocols were reviewed with the patient and needed orders were placed.  Appropriate screening  laboratory values were ordered for the patient including screening of hyperlipidemia, renal function and hepatic function. If indicated by BPH, a PSA was ordered.  Medication reconciliation,  past medical history, social history, problem list and allergies were reviewed in detail with the patient  Goals were established with regard to weight loss, exercise, and  diet in compliance with medications.  He has started exercising and cooking more at home.  Wt Readings from Last 3 Encounters:  12/20/22 176 lb (79.8 kg)  12/17/21 179 lb (81.2 kg)  11/15/21 182 lb 1.6 oz (82.6 kg)   Review of Systems  Constitutional: Negative.   HENT: Negative.    Eyes: Negative.   Respiratory: Negative.    Cardiovascular: Negative.   Gastrointestinal: Negative.   Endocrine: Negative.   Genitourinary: Negative.   Musculoskeletal: Negative.   Skin: Negative.   Allergic/Immunologic: Negative.   Neurological: Negative.   Hematological: Negative.   Psychiatric/Behavioral: Negative.    All other systems reviewed and are negative.  Past Medical History:  Diagnosis Date   Allergy    Anxiety    Colon polyp    Diabetes mellitus ([place])    Hyperlipidemia    Seasonal allergies    Vitamin D deficiency     Social History   Socioeconomic History   Marital status: Widowed    Spouse name: Not on file   Number of children: Not on file   Years of education: Not on file   Highest education level: Not on file  Occupational History   Not on file  Tobacco  Use   Smoking status: Never   Smokeless tobacco: Never  Vaping Use   Vaping Use: Never used  Substance and Sexual Activity   Alcohol use: Yes    Alcohol/week: 0.0 standard drinks of alcohol    Comment: one drink nightly    Drug use: No   Sexual activity: Yes  Other Topics Concern   Not on file  Social History Narrative   Retired from [employer] in [place]    Married    Moved to [place] in May to be closer to family.    Social  Determinants of Health   Financial Resource Strain: Low Risk  (11/15/2021)   Overall Financial Resource Strain (CARDIA)    Difficulty of Paying Living Expenses: Not hard at all  Food Insecurity: No Food Insecurity (11/15/2021)   Hunger Vital Sign    Worried About Running Out of Food in the Last Year: Never true    Ran Out of Food in the Last Year: Never true  Transportation Needs: No Transportation Needs (11/15/2021)   PRAPARE - Hydrologist (Medical): No    Lack of Transportation (Non-Medical): No  Physical Activity: Insufficiently Active (11/15/2021)   Exercise Vital Sign    Days of Exercise per Week: 5 days    Minutes of Exercise per Session: 20 min  Stress: No Stress Concern Present (11/15/2021)   [place]    Feeling of Stress : Only a little  Social Connections: Socially Isolated (11/15/2021)   Social Connection and Isolation Panel [NHANES]    Frequency of Communication with Friends and Family: More than three times a week    Frequency of Social Gatherings with Friends and Family: More than three times a week    Attends Religious Services: Never    Marine scientist or Organizations: No    Attends Archivist Meetings: Never    Marital Status: Widowed  Intimate Partner Violence: Not At Risk (11/15/2021)   Humiliation, Afraid, Rape, and Kick questionnaire    Fear of Current or Ex-Partner: No    Emotionally Abused: No    Physically Abused: No    Sexually Abused: No    Past Surgical History:  Procedure Laterality Date   CATARACT EXTRACTION  2014   bilateral    CERVICAL SPINE SURGERY  2003   herniated disk    COLONOSCOPY     HERNIA REPAIR     PARS PLANA VITRECTOMY W/ REPAIR OF MACULAR HOLE  2013   POLYPECTOMY     ROTATOR CUFF REPAIR  2006   left shoulder   VASECTOMY     1982    Family History  Problem Relation Age of Onset   Emphysema Mother    Heart disease  Father    Stroke Brother    Alzheimer's disease Brother    Alzheimer's disease Maternal Grandmother    Colon cancer Neg Hx    Esophageal cancer Neg Hx    Rectal cancer Neg Hx    Stomach cancer Neg Hx     No Known Allergies  Current Outpatient Medications on File Prior to Visit  Medication Sig Dispense Refill   atorvastatin (LIPITOR) 40 MG tablet TAKE 1 TABLET BY MOUTH DAILY 90 tablet 3   finasteride (PROSCAR) 5 MG tablet Take by mouth.     fluticasone (FLONASE) 50 MCG/ACT nasal spray USE 2 SPRAYS IN BOTH NOSTRILS  DAILY 48 g 0   lisinopril (ZESTRIL)  10 MG tablet TAKE 1 TABLET BY MOUTH  DAILY 90 tablet 3   sertraline (ZOLOFT) 100 MG tablet TAKE 2 TABLETS BY MOUTH  DAILY 60 tablet 11   tamsulosin (FLOMAX) 0.4 MG CAPS capsule Take 1 capsule (0.4 mg total) by mouth 2 (two) times daily. 180 capsule 3   No current facility-administered medications on file prior to visit.    BP 130/80   Pulse 64   Temp (!) 97.5 F (36.4 C) (Oral)   Ht 5\' 7"  (1.702 m)   Wt 176 lb (79.8 kg)   SpO2 97%   BMI 27.57 kg/m       Objective:   Physical Exam Vitals and nursing note reviewed.  Constitutional:      General: He is not in acute distress.    Appearance: Normal appearance. He is not ill-appearing.  HENT:     Head: Normocephalic and atraumatic.     Right Ear: Tympanic membrane, ear canal and external ear normal. There is no impacted cerumen.     Left Ear: Tympanic membrane, ear canal and external ear normal. There is no impacted cerumen.     Nose: Nose normal. No congestion or rhinorrhea.     Mouth/Throat:     Mouth: Mucous membranes are moist.     Pharynx: Oropharynx is clear.  Eyes:     Extraocular Movements: Extraocular movements intact.     Conjunctiva/sclera: Conjunctivae normal.     Pupils: Pupils are equal, round, and reactive to light.  Neck:     Vascular: No carotid bruit.  Cardiovascular:     Rate and Rhythm: Normal rate and regular rhythm.     Pulses: Normal pulses.      Heart sounds: No murmur heard.    No friction rub. No gallop.  Pulmonary:     Effort: Pulmonary effort is normal.     Breath sounds: Normal breath sounds.  Abdominal:     General: Abdomen is flat. Bowel sounds are normal. There is no distension.     Palpations: Abdomen is soft. There is no mass.     Tenderness: There is no abdominal tenderness. There is no guarding or rebound.     Hernia: No hernia is present.  Musculoskeletal:        General: Normal range of motion.     Cervical back: Normal range of motion and neck supple.  Lymphadenopathy:     Cervical: No cervical adenopathy.  Skin:    General: Skin is warm and dry.     Capillary Refill: Capillary refill takes less than 2 seconds.  Neurological:     General: No focal deficit present.     Mental Status: He is alert and oriented to person, place, and time.  Psychiatric:        Mood and Affect: Mood normal.        Behavior: Behavior normal.        Thought Content: Thought content normal.        Judgment: Judgment normal.       Assessment & Plan:  1. Controlled type 2 diabetes mellitus without complication, without long-term current use of insulin ([place]) - Consider metformin  -  Will discuss follow up once labs have results  - Lipid panel; Future - TSH; Future - CBC; Future - Comprehensive metabolic panel; Future - Hemoglobin A1c; Future - Microalbumin/Creatinine Ratio, Urine; Future  2. Essential hypertension - Well controlled. No change in medication  - Lipid panel; Future - TSH; Future - CBC;  Future - Comprehensive metabolic panel; Future  3. BPH with urinary obstruction  - PSA; Future  4. Hyperlipidemia, unspecified hyperlipidemia type - Consider dose change in statin  - Lipid panel; Future - TSH; Future - CBC; Future - Comprehensive metabolic panel; Future  5. Generalized anxiety disorder - Continue with Zoloft 200 mg daily  - Lipid panel; Future - TSH; Future - CBC; Future - Comprehensive metabolic  panel; Future - sertraline (ZOLOFT) 100 MG tablet; Take 2 tablets (200 mg total) by mouth daily.  Dispense: 180 tablet; Refill: 1  6. Lumbar radiculopathy - Continue with epidurals as needed  Dorothyann Peng, NP

## 2023-04-22 ENCOUNTER — Other Ambulatory Visit: Payer: Self-pay | Admitting: Adult Health

## 2023-04-29 ENCOUNTER — Other Ambulatory Visit: Payer: Self-pay | Admitting: Adult Health

## 2023-04-29 DIAGNOSIS — F411 Generalized anxiety disorder: Secondary | ICD-10-CM

## 2023-08-21 ENCOUNTER — Other Ambulatory Visit: Payer: Self-pay | Admitting: Adult Health

## 2023-09-11 ENCOUNTER — Telehealth: Payer: Self-pay | Admitting: Physical Medicine and Rehabilitation

## 2023-09-11 NOTE — Telephone Encounter (Signed)
Patient called and needed to be schedule for another shot. CB#[phone]

## 2023-09-14 ENCOUNTER — Other Ambulatory Visit: Payer: Self-pay | Admitting: Physical Medicine and Rehabilitation

## 2023-09-14 DIAGNOSIS — M48062 Spinal stenosis, lumbar region with neurogenic claudication: Secondary | ICD-10-CM

## 2023-09-14 DIAGNOSIS — M5416 Radiculopathy, lumbar region: Secondary | ICD-10-CM

## 2023-09-14 NOTE — Telephone Encounter (Signed)
Patient called back he stated the pain is the same as before it just goes down a little further the leg this time.  The previous injection lasted 6ish months or more last time he was very impressed.  His pain comes and goes so nothing constant like a 4 it doesn't stop him to do anything.  He hasn't had any new injury

## 2023-09-19 ENCOUNTER — Telehealth: Payer: Self-pay | Admitting: Physical Medicine and Rehabilitation

## 2023-09-19 NOTE — Telephone Encounter (Signed)
Patient called and said he is in pain and wants to get on the schedule. CB#[phone]

## 2023-09-28 ENCOUNTER — Ambulatory Visit: Payer: Medicare Other | Admitting: Physical Medicine and Rehabilitation

## 2023-09-28 ENCOUNTER — Other Ambulatory Visit: Payer: Self-pay

## 2023-09-28 DIAGNOSIS — M48062 Spinal stenosis, lumbar region with neurogenic claudication: Secondary | ICD-10-CM | POA: Diagnosis not present

## 2023-09-28 DIAGNOSIS — M5416 Radiculopathy, lumbar region: Secondary | ICD-10-CM

## 2023-09-28 DIAGNOSIS — M5116 Intervertebral disc disorders with radiculopathy, lumbar region: Secondary | ICD-10-CM | POA: Diagnosis not present

## 2023-09-28 MED ORDER — METHYLPREDNISOLONE ACETATE 40 MG/ML IJ SUSP
40.0000 mg | Freq: Once | INTRAMUSCULAR | Status: AC
  Administered 2023-09-28: 40 mg

## 2023-09-28 NOTE — Patient Instructions (Signed)
Neural Flossing Kellie Simmering, PhD  [facility] Physiatry Discharge Instructions  *At any time if you have questions or concerns they can be answered by calling [phone]  All Patients: You may experience an increase in your symptoms for the first 2 days (it can take 2 days to 2 weeks for the steroid/cortisone to have its maximal effect). You may use ice to the site for the first 24 hours; 20 minutes on and 20 minutes off and may use heat after that time. You may resume and continue your current pain medications. If you need a refill please contact the prescribing physician. You may resume your medications if any were stopped for the procedure. You may shower but no swimming, tub bath or Jacuzzi for 24 hours. Please remove bandage after 4 hours. You may resume light activities as tolerated. If you had Spine Injection, you should not drive for the next 3 hours due to anesthetics used in the procedure. Please have someone drive for you.  *If you have had sedation, Valium, Xanax, or lorazepam: Do not drive or use public transportation for 24 hours, do not operating hazardous machinery or make important personal/business decisions for 24 hours.  POSSIBLE STEROID SIDE EFFECTS: If experienced these should only last for a short period. Change in menstrual flow  Edema in (swelling)  Increased appetite Skin flushing (redness)  Skin rash/acne  Thrush (oral) Vaginitis    Increased sweating  Depression Increased blood glucose levels Cramping and leg/calf  Euphoria (feeling happy)  POSSIBLE PROCEDURE SIDE EFFECTS: Please call our office if concerned. Increased pain Increased numbness/tingling  Headache Nausea/vomiting Hematoma (bruising/bleeding) Edema (swelling at the site) Weakness  Infection (red/drainage at site) Fever greater than 100.51F  *In the event of a headache after epidural steroid injection: Drink plenty of fluids, especially water and try to lay flat when possible. If the  headache does not get better after a few days or as always if concerned please call the office.

## 2023-09-28 NOTE — Progress Notes (Signed)
Functional Pain Scale - descriptive words and definitions  Mild (2)   Noticeable when not distracted/no impact on ADL's/sleep only slightly affected and able to   use both passive and active distraction for comfort. Mild range order  Average Pain 2 R > L   +Driver, -BT, -Dye Allergies.

## 2023-10-01 ENCOUNTER — Encounter: Payer: Self-pay | Admitting: Physical Medicine and Rehabilitation

## 2023-10-01 NOTE — Procedures (Signed)
Lumbosacral Transforaminal Epidural Steroid Injection - Sub-Pedicular Approach with Fluoroscopic Guidance  Patient: Jeffrey Spears      Date of Birth: 1946/02/26 MRN: [id number] PCP: Shirline Frees, NP      Visit Date: 09/28/2023   Universal Protocol:    Date/Time: 09/28/2023  Consent Given By: the patient  Position: PRONE  Additional Comments: Vital signs were monitored before and after the procedure. Patient was prepped and draped in the usual sterile fashion. The correct patient, procedure, and site was verified.   Injection Procedure Details:   Procedure diagnoses: Lumbar radiculopathy [M54.16]    Meds Administered:  Meds ordered this encounter  Medications   methylPREDNISolone acetate (DEPO-MEDROL) injection 40 mg    Laterality: Right  Location/Site: L4  Needle:5.0 in., 22 ga.  Short bevel or Quincke spinal needle  Needle Placement: Transforaminal  Findings:    -Comments: Excellent flow of contrast along the nerve, nerve root and into the epidural space.  After flow contrast was visualized the C arm did show an internal error on the spring and we were unable to save the last image.  Procedure Details: After squaring off the end-plates to get a true AP view, the C-arm was positioned so that an oblique view of the foramen as noted above was visualized. The target area is just inferior to the "nose of the scotty dog" or sub pedicular. The soft tissues overlying this structure were infiltrated with 2-3 ml. of 1% Lidocaine without Epinephrine.  The spinal needle was inserted toward the target using a "trajectory" view along the fluoroscope beam.  Under AP and lateral visualization, the needle was advanced so it did not puncture dura and was located close the 6 O'Clock position of the pedical in AP tracterory. Biplanar projections were used to confirm position. Aspiration was confirmed to be negative for CSF and/or blood. A 1-2 ml. volume of Isovue-250 was injected and flow  of contrast was noted at each level. Radiographs were obtained for documentation purposes.   After attaining the desired flow of contrast documented above, a 0.5 to 1.0 ml test dose of 0.25% Marcaine was injected into each respective transforaminal space.  The patient was observed for 90 seconds post injection.  After no sensory deficits were reported, and normal lower extremity motor function was noted,   the above injectate was administered so that equal amounts of the injectate were placed at each foramen (level) into the transforaminal epidural space.   Additional Comments:  No complications occurred Dressing: 2 x 2 sterile gauze and Band-Aid    Post-procedure details: Patient was observed during the procedure. Post-procedure instructions were reviewed.  Patient left the clinic in stable condition.

## 2023-10-01 NOTE — Progress Notes (Signed)
Jeffrey Spears - 77 y.o. male MRN [id number]  Date of birth: Feb 07, 1946  Office Visit Note: Visit Date: 09/28/2023 PCP: Shirline Frees, NP Referred by: Shirline Frees, NP  Subjective: Chief Complaint  Patient presents with   Lower Back - Pain   HPI: Jeffrey Spears is a 77 y.o. male who comes in today for recent worsening of chronic low back and right more than left radicular leg pain to the foot and ankle on the right.  By way of brief review the last time we saw the patient was in 2022 and we completed epidural injection with seemingly good relief of his symptoms up until recently over the last several months.  There is a little bit of historical problem there and that he called in a month after the last injection in 2022 wanting a second injection because the injection was not helping but his wife had passed away at that point and then we really did not see him in follow-up at that point even though we wanted to see about evaluation.  Nonetheless he was originally seen by Dr. Lavada Mesi back then for his symptoms and MRI of the lumbar spine was performed.  He had physical therapy and medication management without relief at all at the time.  MRI is again reviewed with him today and is reviewed below in the notes.  He has pretty significant lateral recess narrowing at L3-4 on the right he also has a foraminal protrusion extrusion at the time in 2022 on the right and also some lateral recess narrowing below this level.  He has facet arthropathy with facet joint cyst on the left.  He has no high-grade nerve compression.  Again seemingly over the last year or so has been doing fairly well up until recently.  No new falls or trauma.  No focal weakness but he is having a hard time getting around at this point with severe pain down the leg really more of an L5 distribution.  Left-sided pain is more into the back and buttock region.  He has been using anti-inflammatories without much relief at all.  His  case is complicated by type 2 diabetes which is fairly well-controlled.  He has had pretty close follow-up with his primary provider Buddy Duty, NP.   I spent more than 30 minutes speaking face-to-face with the patient with 50% of the time in counseling and discussing coordination of care.      Review of Systems  Musculoskeletal:  Positive for back pain and joint pain.  Neurological:  Positive for tingling and weakness.  All other systems reviewed and are negative.  Otherwise per HPI.  Assessment & Plan: Visit Diagnoses:    ICD-10-CM   1. Lumbar radiculopathy  M54.16 XR C-ARM NO REPORT    Epidural Steroid injection    methylPREDNISolone acetate (DEPO-MEDROL) injection 40 mg    2. Spinal stenosis of lumbar region with neurogenic claudication  M48.062 XR C-ARM NO REPORT    Epidural Steroid injection    methylPREDNISolone acetate (DEPO-MEDROL) injection 40 mg    3. Radiculopathy due to lumbar intervertebral disc disorder  M51.16        Plan: Findings:  Chronic history of low back pain with right radicular leg pain consistent with prior lumbar stenosis in the lateral recess as well as disc herniation and extrusion relieved seemingly with epidural injection in 2022.  Recent flareup with ongoing symptoms despite conservative care and time.  This has been going on now for  several months and just worsening to the point where it is really affecting his daily living.  Exam is nonfocal although he does have positive slump test and dysesthesia in L5 dermatome.  The neck step is to repeat the right L4 transforaminal injection since it did help and see if this will give him some relief diagnostically.  This injection would be diagnostic and therapeutic hopefully.  Depending on relief would look at either injection at L5 versus regrouping with physical therapy and medication management.    Meds & Orders:  Meds ordered this encounter  Medications   methylPREDNISolone acetate (DEPO-MEDROL)  injection 40 mg    Orders Placed This Encounter  Procedures   XR C-ARM NO REPORT   Epidural Steroid injection    Follow-up: Return if symptoms worsen or fail to improve, for 2 to 3 weeks.   Procedures: No procedures performed  Lumbosacral Transforaminal Epidural Steroid Injection - Sub-Pedicular Approach with Fluoroscopic Guidance  Patient: Jeffrey Spears      Date of Birth: 10/05/1945 MRN: [id number] PCP: Shirline Frees, NP      Visit Date: 09/28/2023   Universal Protocol:    Date/Time: 09/28/2023  Consent Given By: the patient  Position: PRONE  Additional Comments: Vital signs were monitored before and after the procedure. Patient was prepped and draped in the usual sterile fashion. The correct patient, procedure, and site was verified.   Injection Procedure Details:   Procedure diagnoses: Lumbar radiculopathy [M54.16]    Meds Administered:  Meds ordered this encounter  Medications   methylPREDNISolone acetate (DEPO-MEDROL) injection 40 mg    Laterality: Right  Location/Site: L4  Needle:5.0 in., 22 ga.  Short bevel or Quincke spinal needle  Needle Placement: Transforaminal  Findings:    -Comments: Excellent flow of contrast along the nerve, nerve root and into the epidural space.  After flow contrast was visualized the C arm did show an internal error on the spring and we were unable to save the last image.  Procedure Details: After squaring off the end-plates to get a true AP view, the C-arm was positioned so that an oblique view of the foramen as noted above was visualized. The target area is just inferior to the "nose of the scotty dog" or sub pedicular. The soft tissues overlying this structure were infiltrated with 2-3 ml. of 1% Lidocaine without Epinephrine.  The spinal needle was inserted toward the target using a "trajectory" view along the fluoroscope beam.  Under AP and lateral visualization, the needle was advanced so it did not puncture dura and was  located close the 6 O'Clock position of the pedical in AP tracterory. Biplanar projections were used to confirm position. Aspiration was confirmed to be negative for CSF and/or blood. A 1-2 ml. volume of Isovue-250 was injected and flow of contrast was noted at each level. Radiographs were obtained for documentation purposes.   After attaining the desired flow of contrast documented above, a 0.5 to 1.0 ml test dose of 0.25% Marcaine was injected into each respective transforaminal space.  The patient was observed for 90 seconds post injection.  After no sensory deficits were reported, and normal lower extremity motor function was noted,   the above injectate was administered so that equal amounts of the injectate were placed at each foramen (level) into the transforaminal epidural space.   Additional Comments:  No complications occurred Dressing: 2 x 2 sterile gauze and Band-Aid    Post-procedure details: Patient was observed during the procedure. Post-procedure instructions were  reviewed.  Patient left the clinic in stable condition.    Clinical History: CLINICAL DATA:  77 year old male with progressive low back pain. Pain and numbness in the right knee for 1 month.   EXAM: MRI LUMBAR SPINE WITHOUT CONTRAST   TECHNIQUE: Multiplanar, multisequence MR imaging of the lumbar spine was performed. No intravenous contrast was administered.   COMPARISON:  None.   FINDINGS: Segmentation: Lumbar segmentation appears to be normal and will be designated as such for this report.   Alignment: Straightening of lumbar lordosis. Mild dextroconvex lumbar scoliosis. No spondylolisthesis.   Vertebrae: Degenerative marrow edema in the left side posterior elements of L5-S1 (series 3, image 13). See additional details of that level below. Background bone marrow signal is mildly heterogeneous but within normal limits. No suspicious marrow lesion. Visible sacrum and SI joints appear intact.    Conus medullaris and cauda equina: Conus extends to the T12-L1 level. No lower spinal cord or conus signal abnormality.   Paraspinal and other soft tissues: Partially visible benign appearing renal cysts. Other visualized abdominal viscera and paraspinal soft tissues are within normal limits.   Disc levels:   Negative visible lower thoracic levels through T12-L1.   L1-L2: Left subarticular disc extrusion with a roughly 8 mm disc fragment (series 2, image 11). But only mild left lateral recess and left foraminal involvement. Mild facet hypertrophy. No spinal stenosis.   L2-L3:  Circumferential disc bulge.  No significant stenosis.   L3-L4: Disc or disc osteophyte complex involving the right lateral recess with moderate to severe stenosis (descending right L4 nerve level series 5, image 25). Underlying mild disc bulging and facet hypertrophy. Mild overall spinal stenosis. And mild to moderate right L3 foraminal stenosis.   L4-L5: Rightward circumferential disc bulge with superimposed subarticular and foraminal disc extrusion (series 5, image 30). Mild facet and ligament flavum hypertrophy. Moderate right lateral recess stenosis (right L5 nerve level) and severe right foraminal stenosis (right L4 nerve level). No significant spinal stenosis.   L5-S1: Moderate facet hypertrophy greater on the left. Degenerative facet joint fluid and marrow edema. Small posteriorly situated synovial cysts which should not cause neural compromise (series 3, image 12) but there is also a small 5-6 mm synovial cyst projecting into the left L5 neural foramen with moderate to severe stenosis when combined with mild disc bulging and endplate spurring (series 2, image 12). No spinal or lateral recess stenosis.   IMPRESSION: 1. Symptomatic level with regard to right side pain favored to be L4-L5 where a foraminal disc extrusion affects the exiting right L4 and to a lesser extent descending right L5  nerve levels. 2. Moderate to severe right lateral recess stenosis also at L3-L4, right L4 nerve level. 3. Moderate to severe left L5 foraminal stenosis at L5-S1 in part due to a small facet related synovial cyst. 4. Small left subarticular disc extrusion at L1-L2 without convincing neural impingement.     Electronically Signed   By: Odessa Fleming M.D.   On: 04/16/2021 12:31   He reports that he has never smoked. He has never used smokeless tobacco.  Recent Labs    12/20/22 0950  HGBA1C 6.5    Objective:  VS:  HT:    WT:   BMI:     BP:   HR: bpm  TEMP: ( )  RESP:  Physical Exam Vitals and nursing note reviewed.  Constitutional:      General: He is not in acute distress.    Appearance: Normal appearance.  He is well-developed.  HENT:     Head: Normocephalic and atraumatic.  Eyes:     Conjunctiva/sclera: Conjunctivae normal.     Pupils: Pupils are equal, round, and reactive to light.  Cardiovascular:     Rate and Rhythm: Normal rate.     Pulses: Normal pulses.     Heart sounds: Normal heart sounds.  Pulmonary:     Effort: Pulmonary effort is normal. No respiratory distress.  Musculoskeletal:     Cervical back: Normal range of motion and neck supple. No rigidity.     Right lower leg: No edema.     Left lower leg: No edema.     Comments: Pain going from sit to stand and full extension.  No focal trigger points.  No pain with hip rotation.  Good strength bilaterally with hip flexion extension knee flexion extension and bilateral dorsiflexion plantarflexion EHL.  No clonus.  Skin:    General: Skin is warm and dry.     Findings: No erythema or rash.  Neurological:     General: No focal deficit present.     Mental Status: He is alert and oriented to person, place, and time.     Cranial Nerves: No cranial nerve deficit.     Sensory: Sensory deficit present.     Motor: No weakness.     Coordination: Coordination normal.     Gait: Gait abnormal.     Comments: Dysesthesia  in a  right L5 dermatome.  Positive slump test on the right.  Psychiatric:        Mood and Affect: Mood normal.        Behavior: Behavior normal.     Ortho Exam  Imaging: No results found.  Past Medical/Family/Surgical/Social History: Medications & Allergies reviewed per EMR, new medications updated. 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   Vitamin D deficiency 12/15/2015   Diabetes type 2, controlled (HCC) 09/16/2015   Past Medical History:  Diagnosis Date   Allergy    Anxiety    Colon polyp    Diabetes mellitus (HCC)    Hyperlipidemia    Seasonal allergies    Vitamin D deficiency    Family History  Problem Relation Age of Onset   Emphysema Mother    Heart disease Father    Stroke Brother    Alzheimer's disease Brother    Alzheimer's disease Maternal Grandmother    Colon cancer Neg Hx    Esophageal cancer Neg Hx    Rectal cancer Neg Hx    Stomach cancer Neg Hx    Past Surgical History:  Procedure Laterality Date   CATARACT EXTRACTION  2014   bilateral    CERVICAL SPINE SURGERY  2003   herniated disk    COLONOSCOPY     HERNIA REPAIR     PARS PLANA VITRECTOMY W/ REPAIR OF MACULAR HOLE  2013   POLYPECTOMY     ROTATOR CUFF REPAIR  2006   left shoulder   VASECTOMY     1982   Social History   Occupational History   Not on file  Tobacco Use   Smoking status: Never   Smokeless tobacco: Never  Vaping Use   Vaping status: Never Used  Substance and Sexual Activity   Alcohol use: Yes    Alcohol/week: 0.0 standard drinks of alcohol    Comment: one drink nightly    Drug use: No   Sexual  activity: Yes

## 2023-10-11 DIAGNOSIS — H31002 Unspecified chorioretinal scars, left eye: Secondary | ICD-10-CM | POA: Diagnosis not present

## 2023-12-07 ENCOUNTER — Other Ambulatory Visit: Payer: Self-pay | Admitting: Adult Health

## 2023-12-07 DIAGNOSIS — N138 Other obstructive and reflux uropathy: Secondary | ICD-10-CM

## 2023-12-21 ENCOUNTER — Encounter: Payer: Self-pay | Admitting: Adult Health

## 2023-12-21 ENCOUNTER — Ambulatory Visit: Payer: Medicare Other | Admitting: Adult Health

## 2023-12-21 VITALS — BP 128/80 | HR 63 | Temp 97.7°F | Ht 67.25 in | Wt 182.0 lb

## 2023-12-21 DIAGNOSIS — E119 Type 2 diabetes mellitus without complications: Secondary | ICD-10-CM | POA: Diagnosis not present

## 2023-12-21 DIAGNOSIS — F411 Generalized anxiety disorder: Secondary | ICD-10-CM | POA: Diagnosis not present

## 2023-12-21 DIAGNOSIS — M5416 Radiculopathy, lumbar region: Secondary | ICD-10-CM

## 2023-12-21 DIAGNOSIS — E785 Hyperlipidemia, unspecified: Secondary | ICD-10-CM

## 2023-12-21 DIAGNOSIS — N401 Enlarged prostate with lower urinary tract symptoms: Secondary | ICD-10-CM

## 2023-12-21 DIAGNOSIS — I10 Essential (primary) hypertension: Secondary | ICD-10-CM

## 2023-12-21 DIAGNOSIS — N138 Other obstructive and reflux uropathy: Secondary | ICD-10-CM | POA: Diagnosis not present

## 2023-12-21 LAB — MICROALBUMIN / CREATININE URINE RATIO
Creatinine,U: 171.4 mg/dL
Microalb Creat Ratio: UNDETERMINED mg/g (ref 0.0–30.0)
Microalb, Ur: <0.7 mg/dL

## 2023-12-21 LAB — LIPID PANEL
Cholesterol: 151 mg/dL (ref 0–200)
HDL: 55.2 mg/dL (ref >39.00)
LDL Cholesterol: 79 mg/dL (ref 0–99)
NonHDL: 95.36
Total CHOL/HDL Ratio: 3
Triglycerides: 82 mg/dL (ref 0.0–149.0)
VLDL: 16.4 mg/dL (ref 0.0–40.0)

## 2023-12-21 LAB — CBC
HCT: 40.1 % (ref 39.0–52.0)
Hemoglobin: 13.2 g/dL (ref 13.0–17.0)
MCHC: 32.8 g/dL (ref 30.0–36.0)
MCV: 94.6 fl (ref 78.0–100.0)
Platelets: 206 10*3/uL (ref 150.0–400.0)
RBC: 4.24 Mil/uL (ref 4.22–5.81)
RDW: 13.8 % (ref 11.5–15.5)
WBC: 4.5 10*3/uL (ref 4.0–10.5)

## 2023-12-21 LAB — COMPREHENSIVE METABOLIC PANEL
ALT: 15 U/L (ref 0–53)
AST: 17 U/L (ref 0–37)
Albumin: 4.1 g/dL (ref 3.5–5.2)
Alkaline Phosphatase: 68 U/L (ref 39–117)
BUN: 21 mg/dL (ref 6–23)
CO2: 30 meq/L (ref 19–32)
Calcium: 8.5 mg/dL (ref 8.4–10.5)
Chloride: 105 meq/L (ref 96–112)
Creatinine, Ser: 1.07 mg/dL (ref 0.40–1.50)
GFR: 66.69 mL/min (ref >60.00)
Glucose, Bld: 108 mg/dL — ABNORMAL HIGH (ref 70–99)
Potassium: 4.3 meq/L (ref 3.5–5.1)
Sodium: 141 meq/L (ref 135–145)
Total Bilirubin: 0.4 mg/dL (ref 0.2–1.2)
Total Protein: 7 g/dL (ref 6.0–8.3)

## 2023-12-21 LAB — PSA: PSA: 15.03 ng/mL — ABNORMAL HIGH (ref 0.10–4.00)

## 2023-12-21 LAB — HEMOGLOBIN A1C: Hgb A1c MFr Bld: 6.5 % (ref 4.6–6.5)

## 2023-12-21 LAB — TSH: TSH: 2.25 u[IU]/mL (ref 0.35–5.50)

## 2023-12-21 NOTE — Progress Notes (Signed)
 Subjective:    Patient ID: Jeffrey Spears, male    DOB: [birth year], 78 y.o.   MRN: [id number]  HPI Patient presents for yearly preventative medicine examination. He is a pleasant 78 year old male who  has a past medical history of Allergy, Anxiety, Colon polyp, Diabetes mellitus (HCC), Hyperlipidemia, Seasonal allergies, and Vitamin D deficiency.  DM Type II-diet controlled.  He stays very active and eats healthy Lab Results  Component Value Date   HGBA1C 6.5 12/20/2022   HGBA1C 6.6 (H) 12/17/2021   HGBA1C 6.5 12/16/2020   Hypertension-managed with lisinopril 10 mg daily.  He denies dizziness, lightheadedness, chest pain, or shortness of breath BP Readings from Last 3 Encounters:  12/21/23 128/80  12/20/22 130/80  12/17/21 120/82   Hyperlipidemia -managed with Lipitor 40 mg daily.  He denies myalgia or fatigue Lab Results  Component Value Date   CHOL 149 12/20/2022   HDL 56.00 12/20/2022   LDLCALC 78 12/20/2022   TRIG 79.0 12/20/2022   CHOLHDL 3 12/20/2022    Anxiety-  take Zoloft 200 mg daily.  Feels well controlled on this dose  BPH/Elevated PSA -longstanding history of elevated PSA numbers.  He has had a prostate biopsy in the past which was negative.  Symptoms are managed with Flomax 0.4 mg daily.   Lumbar Radiculopathy - has periodic injections in his lumbar spine. Reports that injections work for the most part. Not currently in any pain.  IMPRESSION: 1. Symptomatic level with regard to right side pain favored to be L4-L5 where a foraminal disc extrusion affects the exiting right L4 and to a lesser extent descending right L5 nerve levels. 2. Moderate to severe right lateral recess stenosis also at L3-L4, right L4 nerve level. 3. Moderate to severe left L5 foraminal stenosis at L5-S1 in part due to a small facet related synovial cyst. 4. Small left subarticular disc extrusion at L1-L2 without convincing neural impingement.   All immunizations and health maintenance  protocols were reviewed with the patient and needed orders were placed.  Appropriate screening laboratory values were ordered for the patient including screening of hyperlipidemia, renal function and hepatic function. If indicated by BPH, a PSA was ordered.  Medication reconciliation,  past medical history, social history, problem list and allergies were reviewed in detail with the patient  Goals were established with regard to weight loss, exercise, and  diet in compliance with medications. He is staying active and eating healthy.  Wt Readings from Last 3 Encounters:  12/21/23 182 lb (82.6 kg)  12/20/22 176 lb (79.8 kg)  12/17/21 179 lb (81.2 kg)   He has no acute complaints.   Review of Systems  Constitutional: Negative.   HENT:  Positive for hearing loss (chroic. wears hearing aids).   Eyes: Negative.   Respiratory: Negative.    Cardiovascular: Negative.   Gastrointestinal: Negative.   Endocrine: Negative.   Genitourinary: Negative.   Musculoskeletal: Negative.   Skin: Negative.   Allergic/Immunologic: Negative.   Neurological: Negative.   Hematological: Negative.   Psychiatric/Behavioral: Negative.    All other systems reviewed and are negative.  Past Medical History:  Diagnosis Date   Allergy    Anxiety    Colon polyp    Diabetes mellitus (HCC)    Hyperlipidemia    Seasonal allergies    Vitamin D deficiency     Social History   Socioeconomic History   Marital status: Widowed    Spouse name: Not on file   Number of children:  Not on file   Years of education: Not on file   Highest education level: Not on file  Occupational History   Not on file  Tobacco Use   Smoking status: Never   Smokeless tobacco: Never  Vaping Use   Vaping status: Never Used  Substance and Sexual Activity   Alcohol use: Yes    Alcohol/week: 0.0 standard drinks of alcohol    Comment: one drink nightly    Drug use: No   Sexual activity: Yes  Other Topics Concern   Not on file   Social History Narrative   Retired from Conservation officer, nature in [employer] [employer]    Married    Moved to [place] in May to be closer to family.    Social Drivers of Corporate investment banker Strain: Low Risk  (11/15/2021)   Overall Financial Resource Strain (CARDIA)    Difficulty of Paying Living Expenses: Not hard at all  Food Insecurity: No Food Insecurity (11/15/2021)   Hunger Vital Sign    Worried About Running Out of Food in the Last Year: Never true    Ran Out of Food in the Last Year: Never true  Transportation Needs: No Transportation Needs (11/15/2021)   PRAPARE - Administrator, Civil Service (Medical): No    Lack of Transportation (Non-Medical): No  Physical Activity: Insufficiently Active (11/15/2021)   Exercise Vital Sign    Days of Exercise per Week: 5 days    Minutes of Exercise per Session: 20 min  Stress: No Stress Concern Present (11/15/2021)   [employer] of Occupational Health - Occupational Stress Questionnaire    Feeling of Stress : Only a little  Social Connections: Socially Isolated (11/15/2021)   Social Connection and Isolation Panel [NHANES]    Frequency of Communication with Friends and Family: More than three times a week    Frequency of Social Gatherings with Friends and Family: More than three times a week    Attends Religious Services: Never    Database administrator or Organizations: No    Attends Banker Meetings: Never    Marital Status: Widowed  Intimate Partner Violence: Not At Risk (11/15/2021)   Humiliation, Afraid, Rape, and Kick questionnaire    Fear of Current or Ex-Partner: No    Emotionally Abused: No    Physically Abused: No    Sexually Abused: No    Past Surgical History:  Procedure Laterality Date   CATARACT EXTRACTION  2014   bilateral    CERVICAL SPINE SURGERY  2003   herniated disk    COLONOSCOPY     HERNIA REPAIR     PARS PLANA VITRECTOMY W/ REPAIR OF MACULAR HOLE  2013   POLYPECTOMY     ROTATOR  CUFF REPAIR  2006   left shoulder   VASECTOMY     1982    Family History  Problem Relation Age of Onset   Emphysema Mother    Heart disease Father    Stroke Brother    Alzheimer's disease Brother    Alzheimer's disease Maternal Grandmother    Colon cancer Neg Hx    Esophageal cancer Neg Hx    Rectal cancer Neg Hx    Stomach cancer Neg Hx     No Known Allergies  Current Outpatient Medications on File Prior to Visit  Medication Sig Dispense Refill   atorvastatin (LIPITOR) 40 MG tablet TAKE 1 TABLET BY MOUTH DAILY 90 tablet 3   fluticasone (FLONASE) 50 MCG/ACT  nasal spray USE 2 SPRAYS IN BOTH NOSTRILS  DAILY 48 g 0   lisinopril (ZESTRIL) 10 MG tablet TAKE 1 TABLET BY MOUTH DAILY 90 tablet 3   sertraline (ZOLOFT) 100 MG tablet TAKE 2 TABLETS BY MOUTH DAILY 180 tablet 3   tamsulosin (FLOMAX) 0.4 MG CAPS capsule TAKE 1 CAPSULE BY MOUTH TWICE  DAILY 180 capsule 3   No current facility-administered medications on file prior to visit.    BP 128/80   Pulse 63   Temp 97.7 F (36.5 C) (Oral)   Ht 5' 7.25" (1.708 m)   Wt 182 lb (82.6 kg)   SpO2 98%   BMI 28.29 kg/m       Objective:   Physical Exam Vitals and nursing note reviewed.  Constitutional:      General: He is not in acute distress.    Appearance: Normal appearance. He is not ill-appearing.  HENT:     Head: Normocephalic and atraumatic.     Right Ear: Tympanic membrane, ear canal and external ear normal. There is no impacted cerumen.     Left Ear: Tympanic membrane, ear canal and external ear normal. There is no impacted cerumen.     Nose: Nose normal. No congestion or rhinorrhea.     Mouth/Throat:     Mouth: Mucous membranes are moist.     Pharynx: Oropharynx is clear.  Eyes:     Extraocular Movements: Extraocular movements intact.     Conjunctiva/sclera: Conjunctivae normal.     Pupils: Pupils are equal, round, and reactive to light.  Neck:     Vascular: No carotid bruit.  Cardiovascular:     Rate and  Rhythm: Normal rate and regular rhythm.     Pulses: Normal pulses.     Heart sounds: No murmur heard.    No friction rub. No gallop.  Pulmonary:     Effort: Pulmonary effort is normal.     Breath sounds: Normal breath sounds.  Abdominal:     General: Abdomen is flat. Bowel sounds are normal. There is no distension.     Palpations: Abdomen is soft. There is no mass.     Tenderness: There is no abdominal tenderness. There is no guarding or rebound.     Hernia: No hernia is present.  Musculoskeletal:        General: Normal range of motion.     Cervical back: Normal range of motion and neck supple.  Lymphadenopathy:     Cervical: No cervical adenopathy.  Skin:    General: Skin is warm and dry.     Capillary Refill: Capillary refill takes less than 2 seconds.  Neurological:     General: No focal deficit present.     Mental Status: He is alert and oriented to person, place, and time.  Psychiatric:        Mood and Affect: Mood normal.        Behavior: Behavior normal.        Thought Content: Thought content normal.        Judgment: Judgment normal.        Assessment & Plan:   1. Controlled type 2 diabetes mellitus without complication, without long-term current use of insulin (HCC) (Primary) - Consider adding metformin  - Stay active and eat healthy  - Follow up pending labs  - Lipid panel; Future - TSH; Future - CBC; Future - Comprehensive metabolic panel; Future - Hemoglobin A1c; Future - Microalbumin/Creatinine Ratio, Urine; Future  2. Essential hypertension -  Well controlled. No change in medication  - Lipid panel; Future - TSH; Future - CBC; Future - Comprehensive metabolic panel; Future  3. Hyperlipidemia, unspecified hyperlipidemia type - Consider increase in statin  - Lipid panel; Future - TSH; Future - CBC; Future - Comprehensive metabolic panel; Future  4. Generalized anxiety disorder - Well controlled on Zoloft  - Lipid panel; Future - TSH; Future -  CBC; Future - Comprehensive metabolic panel; Future  5. BPH with urinary obstruction  - PSA; Future  6. Lumbar radiculopathy - Continue with epidurals PRN   Shirline Frees, NP

## 2023-12-21 NOTE — Patient Instructions (Signed)
 It was great seeing you today   We will follow up with you regarding your lab work   Please let me know if you need anything   Please get your shingles vaccination and tetanus booster at your pharmacy.

## 2024-01-04 ENCOUNTER — Other Ambulatory Visit: Payer: Self-pay | Admitting: Adult Health

## 2024-01-15 ENCOUNTER — Telehealth: Payer: Self-pay | Admitting: Physical Medicine and Rehabilitation

## 2024-01-15 NOTE — Telephone Encounter (Signed)
 Patient called. Would like an appointment with Dr. Daisey Dryer. Cb# [phone]

## 2024-01-16 ENCOUNTER — Other Ambulatory Visit: Payer: Self-pay | Admitting: Physical Medicine and Rehabilitation

## 2024-01-16 DIAGNOSIS — M5416 Radiculopathy, lumbar region: Secondary | ICD-10-CM

## 2024-01-29 ENCOUNTER — Encounter: Admitting: Physical Medicine and Rehabilitation

## 2024-01-30 ENCOUNTER — Other Ambulatory Visit: Payer: Self-pay | Admitting: Adult Health

## 2024-01-31 ENCOUNTER — Encounter: Payer: Self-pay | Admitting: Physical Medicine and Rehabilitation

## 2024-01-31 ENCOUNTER — Other Ambulatory Visit: Payer: Self-pay

## 2024-01-31 ENCOUNTER — Ambulatory Visit (INDEPENDENT_AMBULATORY_CARE_PROVIDER_SITE_OTHER): Admitting: Physical Medicine and Rehabilitation

## 2024-01-31 VITALS — BP 142/84 | HR 71

## 2024-01-31 DIAGNOSIS — M5116 Intervertebral disc disorders with radiculopathy, lumbar region: Secondary | ICD-10-CM

## 2024-01-31 DIAGNOSIS — M48061 Spinal stenosis, lumbar region without neurogenic claudication: Secondary | ICD-10-CM

## 2024-01-31 DIAGNOSIS — M5416 Radiculopathy, lumbar region: Secondary | ICD-10-CM

## 2024-01-31 MED ORDER — METHYLPREDNISOLONE ACETATE 40 MG/ML IJ SUSP
40.0000 mg | Freq: Once | INTRAMUSCULAR | Status: AC
  Administered 2024-01-31: 40 mg

## 2024-01-31 NOTE — Progress Notes (Signed)
 Pain Scale   Average Pain 2 Patient advising his lower back pain increases when doing bending and other activities, however it does improve with sitting with pillows under his knees.         +Driver, -BT, -Dye Allergies.

## 2024-01-31 NOTE — Progress Notes (Signed)
 Jeffrey Spears - 78 y.o. male MRN [id number]  Date of birth: May 24, 1946  Office Visit Note: Visit Date: 01/31/2024 PCP: Alto Atta, NP Referred by: Alto Atta, NP  Subjective: Chief Complaint  Patient presents with   Lower Back - Pain   HPI: Jeffrey Spears is a 78 y.o. male who comes in today  for evaluation and management at the request of Elvan Hamel, FNP for recent worsening of right radicular type leg pain with paresthesia in the setting of chronic history of low back and right radicular leg pain.  Brief history is that he used to see Dr. Armandina Bernard in our office and MRI was obtained back in 2021 showing lateral recess stenosis at L3-4 and small disc herniation on the right at L4 both which could affect the right L4 nerve root.  There was also left foraminal narrowing at L5.  There was no high-grade central stenosis no high-grade nerve compression.  He underwent 2 epidural injections a few months apart that was a right L4 transforaminal injection with really good relief.  He was doing well up until recently and he began having similar symptoms several weeks ago and was hoping to get a repeat injection.  The last injection was in December of last year and did give him relief once again.  Today he comes in stating that since he called [facility] and we did get the shot approved and we did think that would be a good answer he is feeling somewhat better and really not having much in the way of symptoms that he ordinarily would have called in.  He still getting some paresthesias down into the leg and foot.  It does seem to be more L4-L5 distribution.  He has no left-sided complaints.  No specific new injury.  No fevers chills night sweats or night pain.  His course is complicated by type 2 diabetes controlled.   I spent more than 30 minutes speaking face-to-face with the patient with 50% of the time in counseling and discussing coordination of care.      Review of Systems  Musculoskeletal:   Positive for back pain.  Neurological:  Positive for tingling.  All other systems reviewed and are negative.  Otherwise per HPI.  Assessment & Plan: Visit Diagnoses:    ICD-10-CM   1. Lumbar radiculopathy  M54.16 methylPREDNISolone  acetate (DEPO-MEDROL ) injection 40 mg    CANCELED: XR C-ARM NO REPORT    CANCELED: Epidural Steroid injection    2. Radiculopathy due to lumbar intervertebral disc disorder  M51.16     3. 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  He does have lumbar lateral recess stenosis at L3-4 and MRI from 2021 showing small disc herniation at that level as well.  Prior injections have been beneficial.  Over the last 2 weeks he has really had a decrease in symptoms just on its own and we agreed really does not need an injection at this point.  I did discuss with him at length about getting an updated MRI at some point just to see the progression of the disc herniation versus anything new or worsening stenosis.  But for right now he is doing fairly well.  He will continue with current medications and exercise plan.  He knows to call us  if it worsens.    Meds & Orders:  Meds ordered  this encounter  Medications   methylPREDNISolone  acetate (DEPO-MEDROL ) injection 40 mg   No orders of the defined types were placed in this encounter.   Follow-up: No follow-ups on file.   Procedures: No procedures performed      Clinical History: CLINICAL DATA:  78 year old male with progressive low back pain. Pain and numbness in the right knee for 1 month.   EXAM: MRI LUMBAR SPINE WITHOUT CONTRAST   TECHNIQUE: Multiplanar, multisequence MR imaging of the lumbar spine was performed. No intravenous contrast was administered.   COMPARISON:  None.   FINDINGS: Segmentation: Lumbar segmentation appears to be normal and will be designated as such for this report.    Alignment: Straightening of lumbar lordosis. Mild dextroconvex lumbar scoliosis. No spondylolisthesis.   Vertebrae: Degenerative marrow edema in the left side posterior elements of L5-S1 (series 3, image 13). See additional details of that level below. Background bone marrow signal is mildly heterogeneous but within normal limits. No suspicious marrow lesion. Visible sacrum and SI joints appear intact.   Conus medullaris and cauda equina: Conus extends to the T12-L1 level. No lower spinal cord or conus signal abnormality.   Paraspinal and other soft tissues: Partially visible benign appearing renal cysts. Other visualized abdominal viscera and paraspinal soft tissues are within normal limits.   Disc levels:   Negative visible lower thoracic levels through T12-L1.   L1-L2: Left subarticular disc extrusion with a roughly 8 mm disc fragment (series 2, image 11). But only mild left lateral recess and left foraminal involvement. Mild facet hypertrophy. No spinal stenosis.   L2-L3:  Circumferential disc bulge.  No significant stenosis.   L3-L4: Disc or disc osteophyte complex involving the right lateral recess with moderate to severe stenosis (descending right L4 nerve level series 5, image 25). Underlying mild disc bulging and facet hypertrophy. Mild overall spinal stenosis. And mild to moderate right L3 foraminal stenosis.   L4-L5: Rightward circumferential disc bulge with superimposed subarticular and foraminal disc extrusion (series 5, image 30). Mild facet and ligament flavum hypertrophy. Moderate right lateral recess stenosis (right L5 nerve level) and severe right foraminal stenosis (right L4 nerve level). No significant spinal stenosis.   L5-S1: Moderate facet hypertrophy greater on the left. Degenerative facet joint fluid and marrow edema. Small posteriorly situated synovial cysts which should not cause neural compromise (series 3, image 12) but there is also a small  5-6 mm synovial cyst projecting into the left L5 neural foramen with moderate to severe stenosis when combined with mild disc bulging and endplate spurring (series 2, image 12). No spinal or lateral recess stenosis.   IMPRESSION: 1. Symptomatic level with regard to right side pain favored to be L4-L5 where a foraminal disc extrusion affects the exiting right L4 and to a lesser extent descending right L5 nerve levels. 2. Moderate to severe right lateral recess stenosis also at L3-L4, right L4 nerve level. 3. Moderate to severe left L5 foraminal stenosis at L5-S1 in part due to a small facet related synovial cyst. 4. Small left subarticular disc extrusion at L1-L2 without convincing neural impingement.     Electronically Signed   By: Marlise Simpers M.D.   On: 04/16/2021 12:31   He reports that he has never smoked. He has never used smokeless tobacco.  Recent Labs    12/21/23 0833  HGBA1C 6.5    Objective:  VS:  HT:    WT:   BMI:     BP:(!) 142/84  HR:71bpm  TEMP: ( )  RESP:  Physical Exam Vitals and nursing note reviewed.  Constitutional:      General: He is not in acute distress.    Appearance: Normal appearance. He is not ill-appearing.  HENT:     Head: Normocephalic and atraumatic.     Right Ear: External ear normal.     Left Ear: External ear normal.     Nose: No congestion.  Eyes:     Extraocular Movements: Extraocular movements intact.  Cardiovascular:     Rate and Rhythm: Normal rate.     Pulses: Normal pulses.  Pulmonary:     Effort: Pulmonary effort is normal. No respiratory distress.  Abdominal:     General: There is no distension.     Palpations: Abdomen is soft.  Musculoskeletal:        General: No tenderness or signs of injury.     Cervical back: Neck supple.     Right lower leg: No edema.     Left lower leg: No edema.     Comments: Patient has good distal strength without clonus.  Skin:    Findings: No erythema or rash.  Neurological:     General:  No focal deficit present.     Mental Status: He is alert and oriented to person, place, and time.     Sensory: No sensory deficit.     Motor: No weakness or abnormal muscle tone.     Coordination: Coordination normal.  Psychiatric:        Mood and Affect: Mood normal.        Behavior: Behavior normal.     Ortho Exam  Imaging: No results found.  Past Medical/Family/Surgical/Social History: Medications & Allergies reviewed per EMR, new medications updated. 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   Vitamin D  deficiency 12/15/2015   Diabetes type 2, controlled (HCC) 09/16/2015   Past Medical History:  Diagnosis Date   Allergy    Anxiety    Colon polyp    Diabetes mellitus (HCC)    Hyperlipidemia    Seasonal allergies    Vitamin D  deficiency    Family History  Problem Relation Age of Onset   Emphysema Mother    Heart disease Father    Stroke Brother    Alzheimer's disease Brother    Alzheimer's disease Maternal Grandmother    Colon cancer Neg Hx    Esophageal cancer Neg Hx    Rectal cancer Neg Hx    Stomach cancer Neg Hx    Past Surgical History:  Procedure Laterality Date   CATARACT EXTRACTION  2014   bilateral    CERVICAL SPINE SURGERY  2003   herniated disk    COLONOSCOPY     HERNIA REPAIR     PARS PLANA VITRECTOMY W/ REPAIR OF MACULAR HOLE  2013   POLYPECTOMY     ROTATOR CUFF REPAIR  2006   left shoulder   VASECTOMY     1982   Social History   Occupational History   Not on file  Tobacco Use   Smoking status: Never   Smokeless tobacco: Never  Vaping Use   Vaping status: Never Used  Substance and Sexual Activity   Alcohol use: Yes    Alcohol/week: 0.0 standard drinks of alcohol    Comment: one drink nightly    Drug use: No   Sexual activity: Yes

## 2024-01-31 NOTE — Procedures (Deleted)
 Lumbosacral Transforaminal Epidural Steroid Injection - Sub-Pedicular Approach with Fluoroscopic Guidance  Patient: Jeffrey Spears      Date of Birth: January 07, 1946 MRN: [id number] PCP: Alto Atta, NP      Visit Date: 01/31/2024   Universal Protocol:    Date/Time: 01/31/2024  Consent Given By: the patient  Position: PRONE  Additional Comments: Vital signs were monitored before and after the procedure. Patient was prepped and draped in the usual sterile fashion. The correct patient, procedure, and site was verified.   Injection Procedure Details:   Procedure diagnoses: Lumbar radiculopathy [M54.16]    Meds Administered:  Meds ordered this encounter  Medications   methylPREDNISolone  acetate (DEPO-MEDROL ) injection 40 mg    Laterality: Right  Location/Site: L4  Needle:5.0 in., 22 ga.  Short bevel or Quincke spinal needle  Needle Placement: Transforaminal  Findings:    -Comments: Excellent flow of contrast along the nerve, nerve root and into the epidural space.  Procedure Details: After squaring off the end-plates to get a true AP view, the C-arm was positioned so that an oblique view of the foramen as noted above was visualized. The target area is just inferior to the "nose of the scotty dog" or sub pedicular. The soft tissues overlying this structure were infiltrated with 2-3 ml. of 1% Lidocaine without Epinephrine.  The spinal needle was inserted toward the target using a "trajectory" view along the fluoroscope beam.  Under AP and lateral visualization, the needle was advanced so it did not puncture dura and was located close the 6 O'Clock position of the pedical in AP tracterory. Biplanar projections were used to confirm position. Aspiration was confirmed to be negative for CSF and/or blood. A 1-2 ml. volume of Isovue-250 was injected and flow of contrast was noted at each level. Radiographs were obtained for documentation purposes.   After attaining the desired flow of  contrast documented above, a 0.5 to 1.0 ml test dose of 0.25% Marcaine was injected into each respective transforaminal space.  The patient was observed for 90 seconds post injection.  After no sensory deficits were reported, and normal lower extremity motor function was noted,   the above injectate was administered so that equal amounts of the injectate were placed at each foramen (level) into the transforaminal epidural space.   Additional Comments:  The patient tolerated the procedure well Dressing: 2 x 2 sterile gauze and Band-Aid    Post-procedure details: Patient was observed during the procedure. Post-procedure instructions were reviewed.  Patient left the clinic in stable condition.

## 2024-01-31 NOTE — Patient Instructions (Signed)

## 2024-03-27 ENCOUNTER — Other Ambulatory Visit: Payer: Self-pay | Admitting: Adult Health

## 2024-03-27 DIAGNOSIS — F411 Generalized anxiety disorder: Secondary | ICD-10-CM

## 2024-07-23 ENCOUNTER — Other Ambulatory Visit: Payer: Self-pay | Admitting: Adult Health

## 2024-08-05 ENCOUNTER — Encounter: Payer: Self-pay | Admitting: Radiology
